# Patient Record
Sex: Male | Born: 1950 | Race: Black or African American | Hispanic: No | Marital: Married | Smoking: Former smoker
Health system: Southern US, Community
[De-identification: ages and names within clinical notes are randomized; demographics above are authoritative.]

## PROBLEM LIST (undated history)

## (undated) DIAGNOSIS — C189 Malignant neoplasm of colon, unspecified: Secondary | ICD-10-CM

## (undated) DIAGNOSIS — E119 Type 2 diabetes mellitus without complications: Secondary | ICD-10-CM

## (undated) DIAGNOSIS — I10 Essential (primary) hypertension: Secondary | ICD-10-CM

## (undated) DIAGNOSIS — E7800 Pure hypercholesterolemia, unspecified: Secondary | ICD-10-CM

## (undated) HISTORY — PX: BACK SURGERY: SHX140

## (undated) HISTORY — PX: OTHER SURGICAL HISTORY: SHX169

## (undated) HISTORY — DX: Malignant neoplasm of colon, unspecified: C18.9

## (undated) HISTORY — PX: CARPAL TUNNEL RELEASE: SHX101

## (undated) HISTORY — DX: Type 2 diabetes mellitus without complications: E11.9

## (undated) HISTORY — DX: Essential (primary) hypertension: I10

## (undated) HISTORY — DX: Pure hypercholesterolemia, unspecified: E78.00

---

## 2013-03-07 ENCOUNTER — Encounter (HOSPITAL_COMMUNITY): Payer: Self-pay | Admitting: Oncology

## 2013-03-07 ENCOUNTER — Encounter (HOSPITAL_COMMUNITY): Payer: 59 | Attending: Oncology | Admitting: Oncology

## 2013-03-07 VITALS — BP 120/52 | HR 76 | Temp 97.9°F | Resp 18 | Ht 69.25 in | Wt 172.2 lb

## 2013-03-07 DIAGNOSIS — C182 Malignant neoplasm of ascending colon: Secondary | ICD-10-CM

## 2013-03-07 DIAGNOSIS — I10 Essential (primary) hypertension: Secondary | ICD-10-CM | POA: Insufficient documentation

## 2013-03-07 DIAGNOSIS — D509 Iron deficiency anemia, unspecified: Secondary | ICD-10-CM

## 2013-03-07 DIAGNOSIS — Z807 Family history of other malignant neoplasms of lymphoid, hematopoietic and related tissues: Secondary | ICD-10-CM

## 2013-03-07 DIAGNOSIS — Z09 Encounter for follow-up examination after completed treatment for conditions other than malignant neoplasm: Secondary | ICD-10-CM | POA: Insufficient documentation

## 2013-03-07 DIAGNOSIS — E119 Type 2 diabetes mellitus without complications: Secondary | ICD-10-CM | POA: Insufficient documentation

## 2013-03-07 DIAGNOSIS — Z85038 Personal history of other malignant neoplasm of large intestine: Secondary | ICD-10-CM | POA: Insufficient documentation

## 2013-03-07 DIAGNOSIS — E7800 Pure hypercholesterolemia, unspecified: Secondary | ICD-10-CM | POA: Insufficient documentation

## 2013-03-07 LAB — CBC WITH DIFFERENTIAL/PLATELET
Basophils Relative: 1 % (ref 0–1)
HCT: 31.7 % — ABNORMAL LOW (ref 39.0–52.0)
Hemoglobin: 9.9 g/dL — ABNORMAL LOW (ref 13.0–17.0)
Lymphs Abs: 1.5 10*3/uL (ref 0.7–4.0)
MCHC: 31.2 g/dL (ref 30.0–36.0)
Monocytes Absolute: 0.4 10*3/uL (ref 0.1–1.0)
Monocytes Relative: 8 % (ref 3–12)
Neutro Abs: 2.5 10*3/uL (ref 1.7–7.7)
RBC: 4.3 MIL/uL (ref 4.22–5.81)

## 2013-03-07 LAB — COMPREHENSIVE METABOLIC PANEL
Albumin: 3.7 g/dL (ref 3.5–5.2)
Alkaline Phosphatase: 70 U/L (ref 39–117)
BUN: 6 mg/dL (ref 6–23)
CO2: 28 mEq/L (ref 19–32)
Chloride: 102 mEq/L (ref 96–112)
Creatinine, Ser: 0.91 mg/dL (ref 0.50–1.35)
GFR calc Af Amer: >90 mL/min (ref >90)
GFR calc non Af Amer: 88 mL/min — ABNORMAL LOW (ref >90)
Glucose, Bld: 139 mg/dL — ABNORMAL HIGH (ref 70–99)
Potassium: 3.1 mEq/L — ABNORMAL LOW (ref 3.5–5.1)
Total Bilirubin: 0.3 mg/dL (ref 0.3–1.2)

## 2013-03-07 NOTE — Patient Instructions (Addendum)
[facility] Discharge Instructions  RECOMMENDATIONS MADE BY THE CONSULTANT AND ANY TEST RESULTS WILL BE SENT TO YOUR REFERRING PHYSICIAN.  EXAM FINDINGS BY THE PHYSICIAN TODAY AND SIGNS OR SYMPTOMS TO REPORT TO CLINIC OR PRIMARY PHYSICIAN: Exam and discussion by MD.  Bonita Quin don't need chemotherapy.  You just need to be followed.  We will check some blood work today to see if your iron level is ok.  We will also check some additional blood work.  We will let you know if there are any problems.  MEDICATIONS PRESCRIBED:  none  INSTRUCTIONS GIVEN AND DISCUSSED: Report changes in bowel habits, blood in your bowel movements or other problems.  SPECIAL INSTRUCTIONS/FOLLOW-UP: Blood work every 3 months, CT scan of your abdomen in 1 year.  We will see your back in 6 months in follow-up.  Thank you for choosing [facility] to provide your oncology and hematology care.  To afford each patient quality time with our providers, please arrive at least 15 minutes before your scheduled appointment time.  With your help, our goal is to use those 15 minutes to complete the necessary work-up to ensure our physicians have the information they need to help with your evaluation and healthcare recommendations.    Effective January 1st, 2014, we ask that you re-schedule your appointment with our physicians should you arrive 10 or more minutes late for your appointment.  We strive to give you quality time with our providers, and arriving late affects you and other patients whose appointments are after yours.    Again, thank you for choosing [facility].  Our hope is that these requests will decrease the amount of time that you wait before being seen by our physicians.       _____________________________________________________________  Should you have questions after your visit to [facility], please contact our office at [phone] between the hours of 8:30  a.m. and 5:00 p.m.  Voicemails left after 4:30 p.m. will not be returned until the following business day.  For prescription refill requests, have your pharmacy contact our office with your prescription refill request.

## 2013-03-07 NOTE — Progress Notes (Signed)
Jackson Bowen presented for labwork. Labs per MD order drawn via Peripheral Line 23 gauge needle inserted in right AC  Good blood return present. Procedure without incident.  Needle removed intact. Patient tolerated procedure well.   

## 2013-03-07 NOTE — Progress Notes (Signed)
#1. Stage II A. 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 27 lymph nodes were identified and all were negative. No LV I was seen. No perineural invasion was seen. There is no extension to adjacent structures. The tumor was 7.1 x 6.0 x 2.6 cm. #2 iron deficiency anemia the time of presentation #3 history of cervical disc surgery years ago #4 macular degeneration receiving bevacizumab injections #5 poor dental hygiene #6 left carpal tunnel surgery #7 hypertension #8 diabetes mellitus #9 hypercholesterolemia  This is a very pleasant 62 year old [place] American gentleman for the [place] area is certainly week in September and was found to be anemic by his family physician. He was placed on oral iron recommended to have a colonoscopy. He put this off until he became weaker and weaker and was working until just a couple days before admission. After his hemoglobin was found to be very low he was transferred from the [facility] to the [facility] in [place]. He states he was not transfuse with any blood but was worked up with a CAT scan and colonoscopy and was operated on on 02/07/2013. We are in the process of obtaining all the records. We do have the pathology report. We have a postoperative visit.  He was a smoker for 15 pack years quit many years ago. He and his wife been married 42 years. They have a 76 year old daughter in good health. There is no family history of colon cancer. His father died of vascular disease. He was in his 6s. His mother died at age 65. She had developed congestive heart failure.  He has 2 full sisters and one half sister who died of myeloma many years ago.  He does not use alcohol or drugs. He has no known allergies. He works full-time. He walks for 30 minutes after each days work. His wife is with him today, she has rheumatoid  arthritis.  His oncology review of systems is noncontributory. He states he never had ice craving or sore tongue etc. He did however have colonoscopy at age 43 and interestingly was due to have a repeat colonoscopy in September 2013 when he started developing weakness and fatigue.  BP 120/52  Pulse 76  Temp(Src) 97.9 F (36.6 C) (Oral)  Resp 18  Ht 5' 9.25" (1.759 m)  Wt 172 lb 3.2 oz (78.109 kg)  BMI 25.24 kg/m2  Very pleasant gentleman who has a pigmented benign lesion right lower chin area which she states has been present for at least 40 years. He also has a pigmented nevus at the base of the left first toe laterally and a mildly pigmented lesion lateral right foot. He has decreased posterior tibialis pulses. He has 1+ dorsalis pedis pulses He has no palpable lymphadenopathy in the cervical, supraclavicular, infraclavicular, axillary or anal areas. He has a scar at the base of the neck posteriorly that is well-healed. Lungs are clear to auscultation and percussion. Heart exam shows a regular rhythm and rate without murmur rub or gallop. Nails look mildly pale. He has no obvious joint abnormalities. He has an upper dental plate and a few remaining right teeth which are in very poor repair. Tongue is unremarkable. Throat is clear. Pupils show cataracts bilaterally and small pupils. He has a soft abdomen well-healed scar in the center. He had a laparoscopic surgical procedure. All wounds are well-healed. Bowel sounds are normal. He has no hepatosplenomegaly. Bowel sounds are normal.  He has no obvious ascites. He has no leg edema or arm edema.  He is alert and oriented facial symmetry appears intact.  He needs a CEA every 3 months. I think a CAT scan of his abdomen and pelvis every year for 3 years would be also reasonable. He is not a candidate for chemotherapy with stage IIA disease and has a very good prognosis.  We will check his iron stores at this time. For 6 months he will continue his  iron pills one twice a day since they are not constipating and whatsoever.  I will see him in 6 months. He needs colonoscopy in a year. After his 6 month visit will be seeing him once a year basis

## 2013-03-08 LAB — CEA: CEA: 2.1 ng/mL (ref 0.0–5.0)

## 2013-03-08 LAB — FERRITIN: Ferritin: 145 ng/mL (ref 22–322)

## 2013-03-09 ENCOUNTER — Other Ambulatory Visit (HOSPITAL_COMMUNITY): Payer: Self-pay

## 2013-03-09 ENCOUNTER — Encounter (HOSPITAL_COMMUNITY): Payer: Self-pay

## 2013-03-09 DIAGNOSIS — E876 Hypokalemia: Secondary | ICD-10-CM

## 2013-03-09 MED ORDER — POTASSIUM CHLORIDE CRYS ER 20 MEQ PO TBCR: EXTENDED_RELEASE_TABLET | ORAL | Status: DC

## 2013-04-07 ENCOUNTER — Other Ambulatory Visit (HOSPITAL_COMMUNITY): Payer: Self-pay

## 2013-05-30 ENCOUNTER — Encounter (HOSPITAL_COMMUNITY): Payer: Self-pay

## 2013-05-30 ENCOUNTER — Encounter (HOSPITAL_COMMUNITY): Payer: 59 | Attending: Oncology

## 2013-05-30 DIAGNOSIS — Z85038 Personal history of other malignant neoplasm of large intestine: Secondary | ICD-10-CM

## 2013-05-30 NOTE — Progress Notes (Signed)
Labs drawn today for cea 

## 2013-08-08 ENCOUNTER — Encounter (HOSPITAL_COMMUNITY): Payer: 59 | Attending: Oncology

## 2013-08-08 DIAGNOSIS — C182 Malignant neoplasm of ascending colon: Secondary | ICD-10-CM

## 2013-08-08 DIAGNOSIS — Z85038 Personal history of other malignant neoplasm of large intestine: Secondary | ICD-10-CM

## 2013-08-08 NOTE — Progress Notes (Signed)
Labs drawn today for cea 

## 2013-08-09 ENCOUNTER — Encounter (HOSPITAL_COMMUNITY): Payer: Self-pay

## 2013-09-07 ENCOUNTER — Encounter (HOSPITAL_COMMUNITY): Payer: 59 | Attending: Hematology and Oncology

## 2013-09-07 ENCOUNTER — Encounter (HOSPITAL_COMMUNITY): Payer: Self-pay

## 2013-09-07 VITALS — BP 137/58 | HR 74 | Temp 97.9°F | Resp 20 | Wt 196.3 lb

## 2013-09-07 DIAGNOSIS — D509 Iron deficiency anemia, unspecified: Secondary | ICD-10-CM

## 2013-09-07 DIAGNOSIS — Z85038 Personal history of other malignant neoplasm of large intestine: Secondary | ICD-10-CM

## 2013-09-07 LAB — CBC WITH DIFFERENTIAL/PLATELET
Basophils Absolute: 0.1 10*3/uL (ref 0.0–0.1)
Eosinophils Relative: 3 % (ref 0–5)
HCT: 37.2 % — ABNORMAL LOW (ref 39.0–52.0)
Hemoglobin: 12.3 g/dL — ABNORMAL LOW (ref 13.0–17.0)
Lymphocytes Relative: 34 % (ref 12–46)
Lymphs Abs: 1.5 10*3/uL (ref 0.7–4.0)
MCHC: 33.1 g/dL (ref 30.0–36.0)
MCV: 82.3 fL (ref 78.0–100.0)
Monocytes Absolute: 0.4 10*3/uL (ref 0.1–1.0)
Monocytes Relative: 10 % (ref 3–12)
Neutro Abs: 2.2 10*3/uL (ref 1.7–7.7)
RDW: 12.8 % (ref 11.5–15.5)
WBC: 4.3 10*3/uL (ref 4.0–10.5)

## 2013-09-07 LAB — COMPREHENSIVE METABOLIC PANEL
BUN: 13 mg/dL (ref 6–23)
CO2: 27 mEq/L (ref 19–32)
Calcium: 9.7 mg/dL (ref 8.4–10.5)
Chloride: 98 mEq/L (ref 96–112)
Creatinine, Ser: 0.98 mg/dL (ref 0.50–1.35)
GFR calc Af Amer: >90 mL/min (ref >90)
GFR calc non Af Amer: 86 mL/min — ABNORMAL LOW (ref >90)
Glucose, Bld: 241 mg/dL — ABNORMAL HIGH (ref 70–99)
Total Bilirubin: 0.9 mg/dL (ref 0.3–1.2)

## 2013-09-07 LAB — FERRITIN: Ferritin: 83 ng/mL (ref 22–322)

## 2013-09-07 MED ORDER — ALPRAZOLAM 0.5 MG PO TABS: 0.5000 mg | ORAL_TABLET | Freq: Once | ORAL | Status: DC

## 2013-09-07 NOTE — Patient Instructions (Signed)
[facility] Discharge Instructions  RECOMMENDATIONS MADE BY THE CONSULTANT AND ANY TEST RESULTS WILL BE SENT TO YOUR REFERRING PHYSICIAN.  Return in 6 months for MD visit and repeat lab work.  Thank you for choosing [facility] to provide your oncology and hematology care.  To afford each patient quality time with our providers, please arrive at least 15 minutes before your scheduled appointment time.  With your help, our goal is to use those 15 minutes to complete the necessary work-up to ensure our physicians have the information they need to help with your evaluation and healthcare recommendations.    Effective January 1st, 2014, we ask that you re-schedule your appointment with our physicians should you arrive 10 or more minutes late for your appointment.  We strive to give you quality time with our providers, and arriving late affects you and other patients whose appointments are after yours.    Again, thank you for choosing [facility].  Our hope is that these requests will decrease the amount of time that you wait before being seen by our physicians.       _____________________________________________________________  Should you have questions after your visit to [facility], please contact our office at [phone] between the hours of 8:30 a.m. and 5:00 p.m.  Voicemails left after 4:30 p.m. will not be returned until the following business day.  For prescription refill requests, have your pharmacy contact our office with your prescription refill request.

## 2013-09-07 NOTE — Progress Notes (Signed)
[facility]  OFFICE PROGRESS NOTE  No primary provider on file. No primary provider on file.  DIAGNOSIS: History of colon cancer, stage II - Plan: CBC with Differential, Comprehensive metabolic panel, CBC with Differential, CBC with Differential, Comprehensive metabolic panel, Comprehensive metabolic panel, Comprehensive metabolic panel, CEA  Anemia, iron deficiency - Plan: Ferritin, Ferritin, Ferritin, CBC with Differential, Ferritin  Chief Complaint  Patient presents with  . Follow-up    colon cancer stage II,   . Anemia    iron deficiency    CURRENT THERAPY: Watchful expectation.  INTERVAL HISTORY: Jackson Bowen 62 y.o. male returns for followup of stage II-a adenocarcinoma of the ascending colon, status post right hemicolectomy on 02/07/2013 after which no postoperative therapy was recommended. 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 Appetite is good with no diarrhea, constipation, or hematochezia. He does have melena because he continues to take an iron-containing vitamin. He denies any fever, night sweats, lower extremity swelling or redness, nausea, vomiting, cough, but does experience nasal drip for which he has a nasal spray at home but never uses. He denies any earache, dysuria, hematuria, urinary hesitancy, joint pain, skin rash, headache, or seizures.  MEDICAL HISTORY: Past Medical History  Diagnosis Date  . Diabetes mellitus without complication   . Hypertension   . High blood cholesterol   . Colon cancer     INTERIM HISTORY:  does not have a problem list on file.   Stage II A. 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 27 lymph nodes were identified and all were negative. No LV I was seen. No perineural invasion was seen. There is no extension to adjacent structures. The tumor was 7.1 x 6.0 x 2.6  cm #2 iron deficiency anemia the time of presentation  #3 history of cervical disc surgery years ago  #4 macular degeneration receiving bevacizumab injections  #5 poor dental hygiene  #6 left carpal tunnel surgery  #7 hypertension  #8 diabetes mellitus  #9 hypercholesterolemia  ALLERGIES:  has No Known Allergies.  MEDICATIONS: has a current medication list which includes the following prescription(s): iron, lantus solostar, losartan-hydrochlorothiazide, lovastatin, metformin, UNABLE TO FIND, and fluticasone.  SURGICAL HISTORY:  Past Surgical History  Procedure Laterality Date  . Back surgery      for ruptured disc  . Carpal tunnel release      left hand  . Removal of cyst      right face  . Segmental colectomy      02/07/2013    FAMILY HISTORY: family history includes Cancer in his sister; Diabetes in his mother; Stroke in his father.  SOCIAL HISTORY:  reports that he has quit smoking. His smoking use included Cigarettes. He has a 15 pack-year smoking history. He has never used smokeless tobacco. He reports that he does not drink alcohol or use illicit drugs.  REVIEW OF SYSTEMS:  Other than that discussed above is noncontributory.  PHYSICAL EXAMINATION: ECOG PERFORMANCE STATUS: 0 - Asymptomatic  Blood pressure 137/58, pulse 74, temperature 97.9 F (36.6 C), temperature source Oral, resp. rate 20, weight 196 lb 4.8 oz (89.041 kg).  GENERAL:alert, no distress and comfortable SKIN: skin color, texture, turgor are normal, no rashes or significant lesions EYES: PERLA; Conjunctiva are pink and non-injected, sclera clear OROPHARYNX:no exudate, no erythema on lips, buccal mucosa, or tongue. NECK: supple, thyroid normal size,  non-tender, without nodularity. No masses CHEST: Normal AP diameter with no gynecomastia. LYMPH:  no palpable lymphadenopathy in the cervical, axillary or inguinal LUNGS: clear to auscultation and percussion with normal breathing effort HEART: regular rate &  rhythm and no murmurs. ABDOMEN:abdomen soft, non-tender and normal bowel sounds MUSCULOSKELETAL:no cyanosis of digits and no clubbing. Range of motion normal.  NEURO: alert & oriented x 3 with fluent speech, no focal motor/sensory deficits   LABORATORY DATA: Office Visit on 09/07/2013  Component Date Value Range Status  . WBC 09/07/2013 4.3  4.0 - 10.5 K/uL Final  . RBC 09/07/2013 4.52  4.22 - 5.81 MIL/uL Final  . Hemoglobin 09/07/2013 12.3* 13.0 - 17.0 g/dL Final  . HCT 95/62/1308 37.2* 39.0 - 52.0 % Final  . MCV 09/07/2013 82.3  78.0 - 100.0 fL Final  . MCH 09/07/2013 27.2  26.0 - 34.0 pg Final  . MCHC 09/07/2013 33.1  30.0 - 36.0 g/dL Final  . RDW 65/78/4696 12.8  11.5 - 15.5 % Final  . Platelets 09/07/2013 242  150 - 400 K/uL Final  . Neutrophils Relative % 09/07/2013 51  43 - 77 % Final  . Neutro Abs 09/07/2013 2.2  1.7 - 7.7 K/uL Final  . Lymphocytes Relative 09/07/2013 34  12 - 46 % Final  . Lymphs Abs 09/07/2013 1.5  0.7 - 4.0 K/uL Final  . Monocytes Relative 09/07/2013 10  3 - 12 % Final  . Monocytes Absolute 09/07/2013 0.4  0.1 - 1.0 K/uL Final  . Eosinophils Relative 09/07/2013 3  0 - 5 % Final  . Eosinophils Absolute 09/07/2013 0.1  0.0 - 0.7 K/uL Final  . Basophils Relative 09/07/2013 2* 0 - 1 % Final  . Basophils Absolute 09/07/2013 0.1  0.0 - 0.1 K/uL Final    PATHOLOGY: None new  Urinalysis No results found for this basename: colorurine,  appearanceur,  labspec,  phurine,  glucoseu,  hgbur,  bilirubinur,  ketonesur,  proteinur,  urobilinogen,  nitrite,  leukocytesur    RADIOGRAPHIC STUDIES: No results found.  ASSESSMENT:  #1.Stage II A. 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 27 lymph nodes were identified and all were negative. No LV I was seen. No perineural invasion was seen. There is no extension to adjacent structures. The tumor was 7.1 x 6.0 x 2.6 cm.    #2 iron deficiency anemia the time of presentation  #3 history of cervical disc surgery years ago  #4 macular degeneration receiving bevacizumab injections  #5 poor dental hygiene  #6 left carpal tunnel surgery  #7 hypertension  #8 diabetes mellitus  #9 hypercholesterolemia    PLAN:  #1. Prescription given for alprazolam 0.5-1 mg 40 minutes prior to CT scan to be performed in April of 2015. #2. Told to call should any new symptoms occur that are troublesome and persistent. #3. Office visit 6 months with blood tests including CBC, chem profile, ferritin, and CEA.   All questions were answered. The patient knows to call the clinic with any problems, questions or concerns. We can certainly see the patient much sooner if necessary.   I spent 25 minutes counseling the patient face to face. The total time spent in the appointment was 30 minutes.    Maurilio Lovely, MD 09/07/2013 10:33 AM

## 2013-11-07 ENCOUNTER — Encounter (HOSPITAL_COMMUNITY): Payer: 59 | Attending: Oncology

## 2013-11-07 DIAGNOSIS — Z85038 Personal history of other malignant neoplasm of large intestine: Secondary | ICD-10-CM

## 2013-11-07 LAB — CEA: CEA: 2.9 ng/mL (ref 0.0–5.0)

## 2013-11-07 NOTE — Progress Notes (Signed)
Labs drawn today for cea

## 2014-02-06 ENCOUNTER — Encounter (HOSPITAL_COMMUNITY): Payer: 59 | Attending: Oncology

## 2014-02-06 DIAGNOSIS — C182 Malignant neoplasm of ascending colon: Secondary | ICD-10-CM

## 2014-02-06 DIAGNOSIS — D509 Iron deficiency anemia, unspecified: Secondary | ICD-10-CM | POA: Insufficient documentation

## 2014-02-06 DIAGNOSIS — Z85038 Personal history of other malignant neoplasm of large intestine: Secondary | ICD-10-CM | POA: Insufficient documentation

## 2014-02-06 LAB — CBC WITH DIFFERENTIAL/PLATELET
BASOS ABS: 0 10*3/uL (ref 0.0–0.1)
BASOS PCT: 1 % (ref 0–1)
EOS ABS: 0.1 10*3/uL (ref 0.0–0.7)
EOS PCT: 3 % (ref 0–5)
HEMATOCRIT: 37.3 % — AB (ref 39.0–52.0)
HEMOGLOBIN: 12.2 g/dL — AB (ref 13.0–17.0)
Lymphocytes Relative: 32 % (ref 12–46)
Lymphs Abs: 1.2 10*3/uL (ref 0.7–4.0)
MCH: 26.9 pg (ref 26.0–34.0)
MCHC: 32.7 g/dL (ref 30.0–36.0)
MCV: 82.2 fL (ref 78.0–100.0)
MONO ABS: 0.3 10*3/uL (ref 0.1–1.0)
MONOS PCT: 8 % (ref 3–12)
Neutro Abs: 2.2 10*3/uL (ref 1.7–7.7)
Neutrophils Relative %: 56 % (ref 43–77)
Platelets: 213 10*3/uL (ref 150–400)
RBC: 4.54 MIL/uL (ref 4.22–5.81)
RDW: 12.7 % (ref 11.5–15.5)
WBC: 3.9 10*3/uL — ABNORMAL LOW (ref 4.0–10.5)

## 2014-02-06 LAB — COMPREHENSIVE METABOLIC PANEL
ALBUMIN: 3.7 g/dL (ref 3.5–5.2)
ALT: 29 U/L (ref 0–53)
AST: 37 U/L (ref 0–37)
Alkaline Phosphatase: 67 U/L (ref 39–117)
BILIRUBIN TOTAL: 0.9 mg/dL (ref 0.3–1.2)
BUN: 10 mg/dL (ref 6–23)
CALCIUM: 9.5 mg/dL (ref 8.4–10.5)
CO2: 29 mEq/L (ref 19–32)
Chloride: 98 mEq/L (ref 96–112)
Creatinine, Ser: 0.9 mg/dL (ref 0.50–1.35)
GFR calc Af Amer: >90 mL/min (ref >90)
GFR calc non Af Amer: 88 mL/min — ABNORMAL LOW (ref >90)
Glucose, Bld: 216 mg/dL — ABNORMAL HIGH (ref 70–99)
Potassium: 3.9 mEq/L (ref 3.7–5.3)
Sodium: 138 mEq/L (ref 137–147)
Total Protein: 7.3 g/dL (ref 6.0–8.3)

## 2014-02-06 LAB — FERRITIN: FERRITIN: 136 ng/mL (ref 22–322)

## 2014-02-06 LAB — CEA: CEA: 2.5 ng/mL (ref 0.0–5.0)

## 2014-02-06 NOTE — Progress Notes (Signed)
Labs  Drawn today for cea,cbc/diff,cmp,ferr

## 2014-02-23 ENCOUNTER — Other Ambulatory Visit (HOSPITAL_COMMUNITY): Payer: Self-pay

## 2014-02-23 DIAGNOSIS — D509 Iron deficiency anemia, unspecified: Secondary | ICD-10-CM

## 2014-02-23 DIAGNOSIS — C189 Malignant neoplasm of colon, unspecified: Secondary | ICD-10-CM

## 2014-02-24 ENCOUNTER — Encounter (HOSPITAL_COMMUNITY): Payer: Self-pay | Admitting: Oncology

## 2014-02-24 ENCOUNTER — Other Ambulatory Visit (HOSPITAL_COMMUNITY): Payer: Self-pay | Admitting: Oncology

## 2014-02-24 DIAGNOSIS — C189 Malignant neoplasm of colon, unspecified: Secondary | ICD-10-CM

## 2014-02-24 DIAGNOSIS — C182 Malignant neoplasm of ascending colon: Secondary | ICD-10-CM | POA: Insufficient documentation

## 2014-02-24 HISTORY — DX: Malignant neoplasm of colon, unspecified: C18.9

## 2014-02-27 ENCOUNTER — Ambulatory Visit (HOSPITAL_COMMUNITY): Admission: RE | Admit: 2014-02-27 | Payer: 59 | Source: Ambulatory Visit

## 2014-03-03 ENCOUNTER — Encounter (HOSPITAL_COMMUNITY): Payer: 59 | Attending: Oncology

## 2014-03-03 DIAGNOSIS — C182 Malignant neoplasm of ascending colon: Secondary | ICD-10-CM

## 2014-03-03 DIAGNOSIS — C189 Malignant neoplasm of colon, unspecified: Secondary | ICD-10-CM

## 2014-03-03 DIAGNOSIS — D509 Iron deficiency anemia, unspecified: Secondary | ICD-10-CM

## 2014-03-03 DIAGNOSIS — K7689 Other specified diseases of liver: Secondary | ICD-10-CM | POA: Insufficient documentation

## 2014-03-03 LAB — COMPREHENSIVE METABOLIC PANEL
ALBUMIN: 3.8 g/dL (ref 3.5–5.2)
ALK PHOS: 73 U/L (ref 39–117)
ALT: 29 U/L (ref 0–53)
AST: 36 U/L (ref 0–37)
BILIRUBIN TOTAL: 0.6 mg/dL (ref 0.3–1.2)
BUN: 14 mg/dL (ref 6–23)
CO2: 28 meq/L (ref 19–32)
Calcium: 9.8 mg/dL (ref 8.4–10.5)
Chloride: 99 mEq/L (ref 96–112)
Creatinine, Ser: 1.01 mg/dL (ref 0.50–1.35)
GFR calc Af Amer: 89 mL/min — ABNORMAL LOW (ref >90)
GFR, EST NON AFRICAN AMERICAN: 77 mL/min — AB (ref >90)
Glucose, Bld: 304 mg/dL — ABNORMAL HIGH (ref 70–99)
POTASSIUM: 4.1 meq/L (ref 3.7–5.3)
Sodium: 137 mEq/L (ref 137–147)
Total Protein: 7.3 g/dL (ref 6.0–8.3)

## 2014-03-03 LAB — CBC WITH DIFFERENTIAL/PLATELET
BASOS ABS: 0.1 10*3/uL (ref 0.0–0.1)
BASOS PCT: 1 % (ref 0–1)
Eosinophils Absolute: 0.1 10*3/uL (ref 0.0–0.7)
Eosinophils Relative: 3 % (ref 0–5)
HEMATOCRIT: 37.3 % — AB (ref 39.0–52.0)
HEMOGLOBIN: 12.1 g/dL — AB (ref 13.0–17.0)
Lymphocytes Relative: 37 % (ref 12–46)
Lymphs Abs: 1.5 10*3/uL (ref 0.7–4.0)
MCH: 26.5 pg (ref 26.0–34.0)
MCHC: 32.4 g/dL (ref 30.0–36.0)
MCV: 81.8 fL (ref 78.0–100.0)
MONO ABS: 0.4 10*3/uL (ref 0.1–1.0)
Monocytes Relative: 9 % (ref 3–12)
NEUTROS PCT: 50 % (ref 43–77)
Neutro Abs: 2 10*3/uL (ref 1.7–7.7)
Platelets: 215 10*3/uL (ref 150–400)
RBC: 4.56 MIL/uL (ref 4.22–5.81)
RDW: 13 % (ref 11.5–15.5)
WBC: 4 10*3/uL (ref 4.0–10.5)

## 2014-03-03 LAB — FERRITIN: Ferritin: 122 ng/mL (ref 22–322)

## 2014-03-03 NOTE — Progress Notes (Signed)
Aviv R Drew presented for labwork. Labs per MD order drawn via Peripheral Line 23 gauge needle inserted in right AC  Good blood return present. Procedure without incident.  Needle removed intact. Patient tolerated procedure well.   

## 2014-03-04 LAB — CEA: CEA: 2.7 ng/mL (ref 0.0–5.0)

## 2014-03-06 ENCOUNTER — Ambulatory Visit (HOSPITAL_COMMUNITY): Payer: 59

## 2014-03-06 ENCOUNTER — Other Ambulatory Visit (HOSPITAL_COMMUNITY): Payer: 59

## 2014-03-07 ENCOUNTER — Ambulatory Visit (HOSPITAL_COMMUNITY)
Admission: RE | Admit: 2014-03-07 | Discharge: 2014-03-07 | Disposition: A | Discharge status: Home / Self Care | Payer: 59 | Source: Ambulatory Visit | Attending: Oncology | Admitting: Oncology

## 2014-03-07 ENCOUNTER — Ambulatory Visit (HOSPITAL_COMMUNITY): Payer: 59

## 2014-03-07 DIAGNOSIS — K7689 Other specified diseases of liver: Secondary | ICD-10-CM | POA: Insufficient documentation

## 2014-03-07 DIAGNOSIS — C189 Malignant neoplasm of colon, unspecified: Secondary | ICD-10-CM

## 2014-03-07 MED ORDER — IOHEXOL 300 MG/ML  SOLN
100.0000 mL | Freq: Once | INTRAMUSCULAR | Status: AC | PRN
  Administered 2014-03-07: 100 mL via INTRAVENOUS

## 2014-03-09 ENCOUNTER — Encounter (HOSPITAL_BASED_OUTPATIENT_CLINIC_OR_DEPARTMENT_OTHER): Payer: 59

## 2014-03-09 ENCOUNTER — Encounter (HOSPITAL_COMMUNITY): Payer: Self-pay

## 2014-03-09 VITALS — BP 123/73 | HR 68 | Temp 98.1°F | Resp 18 | Wt 198.6 lb

## 2014-03-09 DIAGNOSIS — K760 Fatty (change of) liver, not elsewhere classified: Secondary | ICD-10-CM

## 2014-03-09 DIAGNOSIS — C189 Malignant neoplasm of colon, unspecified: Secondary | ICD-10-CM

## 2014-03-09 DIAGNOSIS — Z85038 Personal history of other malignant neoplasm of large intestine: Secondary | ICD-10-CM

## 2014-03-09 DIAGNOSIS — D509 Iron deficiency anemia, unspecified: Secondary | ICD-10-CM

## 2014-03-09 NOTE — Progress Notes (Signed)
[facility]  OFFICE PROGRESS NOTE  No PCP Per Patient No address on file  DIAGNOSIS: Colon cancer - Plan: CBC with Differential, Comprehensive metabolic panel, CEA  Iron deficiency anemia - Plan: CBC with Differential  Hepatic steatosis  Chief Complaint  Patient presents with  . Colon Cancer    CURRENT THERAPY: Watchful expectation for stage II-a colon cancer  INTERVAL HISTORY: Jackson Bowen 63 y.o. 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 He continues to do well with good appetite. Bowel movements are regular with no melena, hematochezia, diarrhea, constipation, incontinence, nasal drip, sore throat, earache, cough, shortness of breath, wheezing, fever, night sweats, he sees satiety, abdominal distention or pain, lower extremity swelling or redness, joint pain, skin rash, headache, or seizures.   MEDICAL HISTORY: Past Medical History  Diagnosis Date  . Diabetes mellitus without complication   . Hypertension   . High blood cholesterol   . Colon cancer   . Colon cancer 02/24/2014    INTERIM HISTORY: has Colon cancer on his problem list.    ALLERGIES:  has No Known Allergies.  MEDICATIONS: has a current medication list which includes the following prescription(s): fluticasone, iron, lantus solostar, losartan-hydrochlorothiazide, lovastatin, metformin, and UNABLE TO FIND.  SURGICAL HISTORY:  Past Surgical History  Procedure Laterality Date  . Back surgery      for ruptured disc  . Carpal tunnel release      left hand  . Removal of cyst      right face  . Segmental colectomy      02/07/2013    FAMILY HISTORY: family history includes Cancer in his sister; Diabetes in his mother; Stroke in his father.  SOCIAL HISTORY:  reports that he has quit smoking.  His smoking use included Cigarettes. He has a 15 pack-year smoking history. He has never used smokeless tobacco. He reports that he does not drink alcohol or use illicit drugs.  REVIEW OF SYSTEMS:  Other than that discussed above is noncontributory.  PHYSICAL EXAMINATION: ECOG PERFORMANCE STATUS: 0 - Asymptomatic  Blood pressure 123/73, pulse 68, temperature 98.1 F (36.7 C), temperature source Oral, resp. rate 18, weight 198 lb 9.6 oz (90.084 kg).  GENERAL:alert, no distress and comfortable SKIN: skin color, texture, turgor are normal, no rashes or significant lesions EYES: PERLA; Conjunctiva are pink and non-injected, sclera clear SINUSES: No redness or tenderness over maxillary or ethmoid sinuses OROPHARYNX:no exudate, no erythema on lips, buccal mucosa, or tongue. NECK: supple, thyroid normal size, non-tender, without nodularity. No masses CHEST: Normal AP diameter with no gynecomastia.. Surgical wound well healed with no evidence of hepatosplenomegaly, ascites, or CVA tenderness. LYMPH:  no palpable lymphadenopathy in the cervical, axillary or inguinal LUNGS: clear to auscultation and percussion with normal breathing effort HEART: regular rate & rhythm and no murmurs. ABDOMEN:abdomen soft, non-tender and normal bowel sounds MUSCULOSKELETAL:no cyanosis of digits and no clubbing. Range of motion normal.  NEURO: alert & oriented x 3 with fluent speech, no focal motor/sensory deficits   LABORATORY DATA: Infusion on 03/03/2014  Component Date Value Ref Range Status  . WBC 03/03/2014 4.0  4.0 - 10.5 K/uL Final  . RBC 03/03/2014 4.56  4.22 - 5.81 MIL/uL Final  . Hemoglobin 03/03/2014 12.1* 13.0 - 17.0 g/dL Final  . HCT 03/03/2014 37.3* 39.0 - 52.0 % Final  .  MCV 03/03/2014 81.8  78.0 - 100.0 fL Final  . MCH 03/03/2014 26.5  26.0 - 34.0 pg Final  . MCHC 03/03/2014 32.4  30.0 - 36.0 g/dL Final  . RDW 03/03/2014 13.0  11.5 - 15.5 % Final  . Platelets 03/03/2014 215  150 - 400 K/uL  Final  . Neutrophils Relative % 03/03/2014 50  43 - 77 % Final  . Neutro Abs 03/03/2014 2.0  1.7 - 7.7 K/uL Final  . Lymphocytes Relative 03/03/2014 37  12 - 46 % Final  . Lymphs Abs 03/03/2014 1.5  0.7 - 4.0 K/uL Final  . Monocytes Relative 03/03/2014 9  3 - 12 % Final  . Monocytes Absolute 03/03/2014 0.4  0.1 - 1.0 K/uL Final  . Eosinophils Relative 03/03/2014 3  0 - 5 % Final  . Eosinophils Absolute 03/03/2014 0.1  0.0 - 0.7 K/uL Final  . Basophils Relative 03/03/2014 1  0 - 1 % Final  . Basophils Absolute 03/03/2014 0.1  0.0 - 0.1 K/uL Final  . Sodium 03/03/2014 137  137 - 147 mEq/L Final  . Potassium 03/03/2014 4.1  3.7 - 5.3 mEq/L Final  . Chloride 03/03/2014 99  96 - 112 mEq/L Final  . CO2 03/03/2014 28  19 - 32 mEq/L Final  . Glucose, Bld 03/03/2014 304* 70 - 99 mg/dL Final  . BUN 03/03/2014 14  6 - 23 mg/dL Final  . Creatinine, Ser 03/03/2014 1.01  0.50 - 1.35 mg/dL Final  . Calcium 03/03/2014 9.8  8.4 - 10.5 mg/dL Final  . Total Protein 03/03/2014 7.3  6.0 - 8.3 g/dL Final  . Albumin 03/03/2014 3.8  3.5 - 5.2 g/dL Final  . AST 03/03/2014 36  0 - 37 U/L Final  . ALT 03/03/2014 29  0 - 53 U/L Final  . Alkaline Phosphatase 03/03/2014 73  39 - 117 U/L Final  . Total Bilirubin 03/03/2014 0.6  0.3 - 1.2 mg/dL Final  . GFR calc non Af Amer 03/03/2014 77* >90 mL/min Final  . GFR calc Af Amer 03/03/2014 89* >90 mL/min Final   Comment: (NOTE)                          The eGFR has been calculated using the CKD EPI equation.                          This calculation has not been validated in all clinical situations.                          eGFR's persistently <90 mL/min signify possible Chronic Kidney                          Disease.  . CEA 03/03/2014 2.7  0.0 - 5.0 ng/mL Final   Performed at [facility]  . Ferritin 03/03/2014 122  22 - 322 ng/mL Final   Performed at [facility]: No new pathology.  Urinalysis No results found for this basename:  colorurine,  appearanceur,  labspec,  phurine,  glucoseu,  hgbur,  bilirubinur,  ketonesur,  proteinur,  urobilinogen,  nitrite,  leukocytesur    RADIOGRAPHIC STUDIES: Ct Abdomen Pelvis W Contrast  03/07/2014   CLINICAL DATA:  Followup stage II colon carcinoma.  EXAM: CT ABDOMEN AND PELVIS WITH CONTRAST  TECHNIQUE: Multidetector CT imaging of  the abdomen and pelvis was performed using the standard protocol following bolus administration of intravenous contrast.  CONTRAST:  19m OMNIPAQUE IOHEXOL 300 MG/ML  SOLN  COMPARISON:  None.  FINDINGS: Postop changes are seen from previous right colectomy. Moderate to severe hepatic steatosis is demonstrated, but no liver masses are identified. The gallbladder, pancreas, spleen, adrenal glands are normal in appearance. A small subcapsular cyst is seen in the posterior midpole left kidney, however there is no evidence of complex cystic or solid renal masses. No evidence hydronephrosis.  No lymphadenopathy identified within the abdomen or pelvis. No evidence of bowel wall thickening or dilatation. No evidence of inflammatory process or abnormal fluid collections. No suspicious bone lesions identified.  IMPRESSION: No evidence of recurrent or metastatic carcinoma within the abdomen or pelvis.  Moderate to severe hepatic steatosis.   Electronically Signed   By: JEarle GellM.D.   On: 03/07/2014 09:42    ASSESSMENT:  #1.Stage II A. 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 No LV I was seen. No perineural invasion was seen. There was no extension to adjacent structures. The tumor was 7.1 x 6.0 x 2.6 cm., no evidence of disease. #2 iron deficiency anemia the time of presentation  #3 history of cervical disc surgery years ago  #4 macular degeneration receiving bevacizumab injections  #5 poor dental hygiene  #6 left  carpal tunnel surgery  #7 hypertension  #8 diabetes mellitus  #9 hypercholesterolemia   PLAN:  #1. Continue watchful expectation. #2. Repeat colonoscopy in September 2015. #3. Office visit in 6 months with CBC, chem profile, CEA, and ferritin.   All questions were answered. The patient knows to call the clinic with any problems, questions or concerns. We can certainly see the patient much sooner if necessary.   I spent 25 minutes counseling the patient face to face. The total time spent in the appointment was 30 minutes.    GFarrel Gobble MD 03/09/2014 1:35 PM  DISCLAIMER:  This note was dictated with voice recognition software.  Similar sounding words can inadvertently be transcribed inaccurately and may not be corrected upon review.

## 2014-03-09 NOTE — Addendum Note (Signed)
Addended by: Elenor Legato on: 03/09/2014 01:51 PM   Modules accepted: Orders

## 2014-03-09 NOTE — Patient Instructions (Signed)
[facility] Discharge Instructions  RECOMMENDATIONS MADE BY THE CONSULTANT AND ANY TEST RESULTS WILL BE SENT TO YOUR REFERRING PHYSICIAN.  EXAM FINDINGS BY THE PHYSICIAN TODAY AND SIGNS OR SYMPTOMS TO REPORT TO CLINIC OR PRIMARY PHYSICIAN: dr Barnet Glasgow    INSTRUCTIONS GIVEN AND DISCUSSED:follow up 6 months with labs and doctor    Thank you for choosing [facility] to provide your oncology and hematology care.  To afford each patient quality time with our providers, please arrive at least 15 minutes before your scheduled appointment time.  With your help, our goal is to use those 15 minutes to complete the necessary work-up to ensure our physicians have the information they need to help with your evaluation and healthcare recommendations.    Effective January 1st, 2014, we ask that you re-schedule your appointment with our physicians should you arrive 10 or more minutes late for your appointment.  We strive to give you quality time with our providers, and arriving late affects you and other patients whose appointments are after yours.    Again, thank you for choosing [facility].  Our hope is that these requests will decrease the amount of time that you wait before being seen by our physicians.       _____________________________________________________________  Should you have questions after your visit to [facility], please contact our office at [phone] between the hours of 8:30 a.m. and 5:00 p.m.  Voicemails left after 4:30 p.m. will not be returned until the following business day.  For prescription refill requests, have your pharmacy contact our office with your prescription refill request.

## 2014-09-12 ENCOUNTER — Other Ambulatory Visit (HOSPITAL_COMMUNITY): Payer: 59

## 2014-09-15 ENCOUNTER — Ambulatory Visit (HOSPITAL_COMMUNITY): Payer: 59

## 2014-09-16 NOTE — Progress Notes (Signed)
This encounter was created in error - please disregard.

## 2014-09-21 ENCOUNTER — Encounter (HOSPITAL_COMMUNITY): Payer: 59 | Attending: Hematology and Oncology

## 2014-09-21 ENCOUNTER — Encounter (HOSPITAL_COMMUNITY): Payer: Self-pay

## 2014-09-21 VITALS — BP 107/48 | HR 78 | Temp 99.3°F | Resp 16 | Wt 195.8 lb

## 2014-09-21 DIAGNOSIS — K760 Fatty (change of) liver, not elsewhere classified: Secondary | ICD-10-CM | POA: Diagnosis not present

## 2014-09-21 DIAGNOSIS — D509 Iron deficiency anemia, unspecified: Secondary | ICD-10-CM | POA: Diagnosis not present

## 2014-09-21 DIAGNOSIS — Z85038 Personal history of other malignant neoplasm of large intestine: Secondary | ICD-10-CM

## 2014-09-21 DIAGNOSIS — E119 Type 2 diabetes mellitus without complications: Secondary | ICD-10-CM

## 2014-09-21 DIAGNOSIS — C189 Malignant neoplasm of colon, unspecified: Secondary | ICD-10-CM | POA: Insufficient documentation

## 2014-09-21 DIAGNOSIS — I10 Essential (primary) hypertension: Secondary | ICD-10-CM

## 2014-09-21 LAB — COMPREHENSIVE METABOLIC PANEL
ALT: 26 U/L (ref 0–53)
ANION GAP: 13 (ref 5–15)
AST: 34 U/L (ref 0–37)
Albumin: 3.8 g/dL (ref 3.5–5.2)
Alkaline Phosphatase: 69 U/L (ref 39–117)
BUN: 10 mg/dL (ref 6–23)
CALCIUM: 9.3 mg/dL (ref 8.4–10.5)
CO2: 27 mEq/L (ref 19–32)
Chloride: 98 mEq/L (ref 96–112)
Creatinine, Ser: 1.03 mg/dL (ref 0.50–1.35)
GFR calc Af Amer: 87 mL/min — ABNORMAL LOW (ref >90)
GFR calc non Af Amer: 75 mL/min — ABNORMAL LOW (ref >90)
Glucose, Bld: 187 mg/dL — ABNORMAL HIGH (ref 70–99)
POTASSIUM: 3.6 meq/L — AB (ref 3.7–5.3)
SODIUM: 138 meq/L (ref 137–147)
TOTAL PROTEIN: 7.1 g/dL (ref 6.0–8.3)
Total Bilirubin: 0.5 mg/dL (ref 0.3–1.2)

## 2014-09-21 LAB — CBC WITH DIFFERENTIAL/PLATELET
Basophils Absolute: 0.1 10*3/uL (ref 0.0–0.1)
Basophils Relative: 1 % (ref 0–1)
EOS ABS: 0.1 10*3/uL (ref 0.0–0.7)
Eosinophils Relative: 2 % (ref 0–5)
HCT: 37.3 % — ABNORMAL LOW (ref 39.0–52.0)
Hemoglobin: 12.6 g/dL — ABNORMAL LOW (ref 13.0–17.0)
Lymphocytes Relative: 35 % (ref 12–46)
Lymphs Abs: 1.6 10*3/uL (ref 0.7–4.0)
MCH: 27.9 pg (ref 26.0–34.0)
MCHC: 33.8 g/dL (ref 30.0–36.0)
MCV: 82.7 fL (ref 78.0–100.0)
Monocytes Absolute: 0.4 10*3/uL (ref 0.1–1.0)
Monocytes Relative: 9 % (ref 3–12)
NEUTROS PCT: 53 % (ref 43–77)
Neutro Abs: 2.4 10*3/uL (ref 1.7–7.7)
PLATELETS: 256 10*3/uL (ref 150–400)
RBC: 4.51 MIL/uL (ref 4.22–5.81)
RDW: 13 % (ref 11.5–15.5)
WBC: 4.5 10*3/uL (ref 4.0–10.5)

## 2014-09-21 LAB — FERRITIN: FERRITIN: 107 ng/mL (ref 22–322)

## 2014-09-21 NOTE — Patient Instructions (Addendum)
[facility] Discharge Instructions  RECOMMENDATIONS MADE BY THE CONSULTANT AND ANY TEST RESULTS WILL BE SENT TO YOUR REFERRING PHYSICIAN.  EXAM FINDINGS BY THE PHYSICIAN TODAY AND SIGNS OR SYMPTOMS TO REPORT TO CLINIC OR PRIMARY PHYSICIAN: Exam and findings as discussed by Dr. Barnet Glasgow.  Please contact your gastroenterologist and get a colonoscopy scheduled before the end of the year if possible.  If any issues with your labs we will contact you.  Report unexplained weight loss, blood in your bowel movement or other concerns.     INSTRUCTIONS/FOLLOW-UP: Follow-up in 6 months with labs and office visit.  Thank you for choosing [facility] to provide your oncology and hematology care.  To afford each patient quality time with our providers, please arrive at least 15 minutes before your scheduled appointment time.  With your help, our goal is to use those 15 minutes to complete the necessary work-up to ensure our physicians have the information they need to help with your evaluation and healthcare recommendations.    Effective January 1st, 2014, we ask that you re-schedule your appointment with our physicians should you arrive 10 or more minutes late for your appointment.  We strive to give you quality time with our providers, and arriving late affects you and other patients whose appointments are after yours.    Again, thank you for choosing [facility].  Our hope is that these requests will decrease the amount of time that you wait before being seen by our physicians.       _____________________________________________________________  Should you have questions after your visit to [facility], please contact our office at [phone] between the hours of 8:30 a.m. and 4:30 p.m.  Voicemails left after 4:30 p.m. will not be returned until the following business day.  For prescription refill requests, have your pharmacy contact our  office with your prescription refill request.    _______________________________________________________________  We hope that we have given you very good care.  You may receive a patient satisfaction survey in the mail, please complete it and return it as soon as possible.  We value your feedback!  _______________________________________________________________  Have you asked about our STAR program?  STAR stands for Survivorship Training and Rehabilitation, and this is a nationally recognized cancer care program that focuses on survivorship and rehabilitation.  Cancer and cancer treatments may cause problems, such as, pain, making you feel tired and keeping you from doing the things that you need or want to do. Cancer rehabilitation can help. Our goal is to reduce these troubling effects and help you have the best quality of life possible.  You may receive a survey from a nurse that asks questions about your current state of health.  Based on the survey results, all eligible patients will be referred to the [facility] program for an evaluation so we can better serve you!  A frequently asked questions sheet is available upon request.

## 2014-09-21 NOTE — Progress Notes (Signed)
[facility]  OFFICE PROGRESS NOTE  No PCP Per Patient No address on file  DIAGNOSIS: Colon cancer - Plan: CBC with Differential, Comprehensive metabolic panel, CEA, Ferritin, CBC with Differential, Ferritin, Comprehensive metabolic panel, CEA  Iron deficiency anemia - Plan: CBC with Differential, Comprehensive metabolic panel, CEA, Ferritin, CBC with Differential, Ferritin, Comprehensive metabolic panel, CEA  Hepatic steatosis - Plan: CBC with Differential, Comprehensive metabolic panel, CEA, Ferritin, CBC with Differential, Ferritin, Comprehensive metabolic panel, CEA  Chief Complaint  Patient presents with  . Colon Cancer    CURRENT THERAPY: Watchful expectation for stage II-A colon cancer  INTERVAL HISTORY: Jackson Bowen 63 y.o. 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 He continues to do well with excellent appetite and no nausea, vomiting, diarrhea, or constipation. He denies any melena, hematochezia, hematuria, urinary hesitancy, cough, wheezing, sore throat, skin rash, fever, night sweats, lower extremity swelling or redness, joint pain, headache, or seizures. He has been found to have slightly elevated hemoglobin A1c.  MEDICAL HISTORY: Past Medical History  Diagnosis Date  . Diabetes mellitus without complication   . Hypertension   . High blood cholesterol   . Colon cancer   . Colon cancer 02/24/2014    INTERIM HISTORY: has Colon cancer on his problem list.    ALLERGIES:  has No Known Allergies.  MEDICATIONS: has a current medication list which includes the following prescription(s): iron, lantus solostar, losartan-hydrochlorothiazide, lovastatin, metformin, UNABLE TO FIND, and fluticasone.  SURGICAL HISTORY:  Past Surgical History  Procedure Laterality  Date  . Back surgery      for ruptured disc  . Carpal tunnel release      left hand  . Removal of cyst      right face  . Segmental colectomy      02/07/2013    FAMILY HISTORY: family history includes Cancer in his sister; Diabetes in his mother; Stroke in his father.  SOCIAL HISTORY:  reports that he has quit smoking. His smoking use included Cigarettes. He has a 15 pack-year smoking history. He has never used smokeless tobacco. He reports that he does not drink alcohol or use illicit drugs.  REVIEW OF SYSTEMS:  Other than that discussed above is noncontributory.  PHYSICAL EXAMINATION: ECOG PERFORMANCE STATUS: 1 - Symptomatic but completely ambulatory  Blood pressure 107/48, pulse 78, temperature 99.3 F (37.4 C), temperature source Oral, resp. rate 16, weight 195 lb 12.8 oz (88.814 kg), SpO2 98 %.  GENERAL:alert, no distress and comfortable SKIN: skin color, texture, turgor are normal, no rashes or significant lesions EYES: PERLA; Conjunctiva are pink and non-injected, sclera clear SINUSES: No redness or tenderness over maxillary or ethmoid sinuses OROPHARYNX:no exudate, no erythema on lips, buccal mucosa, or tongue. NECK: supple, thyroid normal size, non-tender, without nodularity. No masses CHEST: Normal AP diameter with no breast masses. LYMPH:  no palpable lymphadenopathy in the cervical, axillary or inguinal LUNGS: clear to auscultation and percussion with normal breathing effort HEART: regular rate & rhythm and no murmurs. ABDOMEN:abdomen soft, non-tender and normal bowel sounds. No hepatomegaly, ascites, or CVA tenderness. MUSCULOSKELETAL:no cyanosis of digits and no clubbing. Range of motion normal.  NEURO: alert & oriented x 3 with fluent speech, no focal motor/sensory deficits   LABORATORY DATA: Office Visit on 09/21/2014  Component Date Value Ref Range Status  .  WBC 09/21/2014 4.5  4.0 - 10.5 K/uL Final  . RBC 09/21/2014 4.51  4.22 - 5.81 MIL/uL Final  .  Hemoglobin 09/21/2014 12.6* 13.0 - 17.0 g/dL Final  . HCT 09/21/2014 37.3* 39.0 - 52.0 % Final  . MCV 09/21/2014 82.7  78.0 - 100.0 fL Final  . MCH 09/21/2014 27.9  26.0 - 34.0 pg Final  . MCHC 09/21/2014 33.8  30.0 - 36.0 g/dL Final  . RDW 09/21/2014 13.0  11.5 - 15.5 % Final  . Platelets 09/21/2014 256  150 - 400 K/uL Final  . Neutrophils Relative % 09/21/2014 53  43 - 77 % Final  . Neutro Abs 09/21/2014 2.4  1.7 - 7.7 K/uL Final  . Lymphocytes Relative 09/21/2014 35  12 - 46 % Final  . Lymphs Abs 09/21/2014 1.6  0.7 - 4.0 K/uL Final  . Monocytes Relative 09/21/2014 9  3 - 12 % Final  . Monocytes Absolute 09/21/2014 0.4  0.1 - 1.0 K/uL Final  . Eosinophils Relative 09/21/2014 2  0 - 5 % Final  . Eosinophils Absolute 09/21/2014 0.1  0.0 - 0.7 K/uL Final  . Basophils Relative 09/21/2014 1  0 - 1 % Final  . Basophils Absolute 09/21/2014 0.1  0.0 - 0.1 K/uL Final  . Sodium 09/21/2014 138  137 - 147 mEq/L Final  . Potassium 09/21/2014 3.6* 3.7 - 5.3 mEq/L Final  . Chloride 09/21/2014 98  96 - 112 mEq/L Final  . CO2 09/21/2014 27  19 - 32 mEq/L Final  . Glucose, Bld 09/21/2014 187* 70 - 99 mg/dL Final  . BUN 09/21/2014 10  6 - 23 mg/dL Final  . Creatinine, Ser 09/21/2014 1.03  0.50 - 1.35 mg/dL Final  . Calcium 09/21/2014 9.3  8.4 - 10.5 mg/dL Final  . Total Protein 09/21/2014 7.1  6.0 - 8.3 g/dL Final  . Albumin 09/21/2014 3.8  3.5 - 5.2 g/dL Final  . AST 09/21/2014 34  0 - 37 U/L Final  . ALT 09/21/2014 26  0 - 53 U/L Final  . Alkaline Phosphatase 09/21/2014 69  39 - 117 U/L Final  . Total Bilirubin 09/21/2014 0.5  0.3 - 1.2 mg/dL Final  . GFR calc non Af Amer 09/21/2014 75* >90 mL/min Final  . GFR calc Af Amer 09/21/2014 87* >90 mL/min Final   Comment: (NOTE) The eGFR has been calculated using the CKD EPI equation. This calculation has not been validated in all clinical situations. eGFR's persistently <90 mL/min signify possible Chronic Kidney Disease.   . Anion gap 09/21/2014  13  5 - 15 Final    PATHOLOGY: No new pathology.  Urinalysis No results found for: COLORURINE, APPEARANCEUR, LABSPEC, PHURINE, GLUCOSEU, HGBUR, BILIRUBINUR, KETONESUR, PROTEINUR, UROBILINOGEN, NITRITE, LEUKOCYTESUR  RADIOGRAPHIC STUDIES: No results found.  ASSESSMENT:  #1.Stage II A. (T3, N0, M0) moderately differentiated adenocarcinoma of the ascending colon status post right hemicolectomy on 02/07/2013 at [facility]. 27 lymph nodes were identified and all were negative. No LV I was seen. No perineural invasion was seen. There was no extension to adjacent structures. The tumor was 7.1 x 6.0 x 2.6 cm., no evidence of disease. #2 iron deficiency anemia the time of presentation  #3 history of cervical disc surgery years ago  #4 macular degeneration receiving bevacizumab injections  #5 poor dental hygiene  #6 left carpal tunnel surgery  #7 hypertension  #8 diabetes mellitus  #9 hypercholesterolemia   PLAN:  #1. Colonoscopy before the end of the year. #2. Follow-up in 6 months  with CBC, chem profile, CEA.   All questions were answered. The patient knows to call the clinic with any problems, questions or concerns. We can certainly see the patient much sooner if necessary.   I spent 25 minutes counseling the patient face to face. The total time spent in the appointment was 30 minutes.    Doroteo Bradford, MD 09/21/2014 6:50 PM  DISCLAIMER:  This note was dictated with voice recognition software.  Similar sounding words can inadvertently be transcribed inaccurately and may not be corrected upon review.

## 2014-09-21 NOTE — Progress Notes (Signed)
Jackson Bowen presented for labwork. Labs per MD order drawn via Peripheral Line 23 gauge needle inserted in right AC  Good blood return present. Procedure without incident.  Needle removed intact. Patient tolerated procedure well.

## 2014-09-22 LAB — CEA: CEA: 2.6 ng/mL (ref 0.0–5.0)

## 2015-03-22 ENCOUNTER — Encounter (HOSPITAL_COMMUNITY): Payer: Self-pay | Admitting: Hematology & Oncology

## 2015-03-22 ENCOUNTER — Encounter (HOSPITAL_COMMUNITY): Payer: 59 | Attending: Hematology & Oncology

## 2015-03-22 ENCOUNTER — Ambulatory Visit (HOSPITAL_COMMUNITY): Payer: 59 | Admitting: Hematology & Oncology

## 2015-03-22 ENCOUNTER — Encounter (HOSPITAL_BASED_OUTPATIENT_CLINIC_OR_DEPARTMENT_OTHER): Payer: 59 | Admitting: Hematology & Oncology

## 2015-03-22 VITALS — BP 122/61 | HR 77 | Temp 98.4°F | Resp 16 | Wt 196.6 lb

## 2015-03-22 DIAGNOSIS — C189 Malignant neoplasm of colon, unspecified: Secondary | ICD-10-CM | POA: Insufficient documentation

## 2015-03-22 DIAGNOSIS — K760 Fatty (change of) liver, not elsewhere classified: Secondary | ICD-10-CM

## 2015-03-22 DIAGNOSIS — Z85038 Personal history of other malignant neoplasm of large intestine: Secondary | ICD-10-CM

## 2015-03-22 DIAGNOSIS — D509 Iron deficiency anemia, unspecified: Secondary | ICD-10-CM | POA: Insufficient documentation

## 2015-03-22 DIAGNOSIS — D649 Anemia, unspecified: Secondary | ICD-10-CM

## 2015-03-22 LAB — COMPREHENSIVE METABOLIC PANEL
ALT: 20 U/L (ref 17–63)
ANION GAP: 9 (ref 5–15)
AST: 28 U/L (ref 15–41)
Albumin: 4 g/dL (ref 3.5–5.0)
Alkaline Phosphatase: 66 U/L (ref 38–126)
BILIRUBIN TOTAL: 0.9 mg/dL (ref 0.3–1.2)
BUN: 11 mg/dL (ref 6–20)
CHLORIDE: 99 mmol/L — AB (ref 101–111)
CO2: 28 mmol/L (ref 22–32)
CREATININE: 1.11 mg/dL (ref 0.61–1.24)
Calcium: 9.2 mg/dL (ref 8.9–10.3)
GFR calc Af Amer: >60 mL/min (ref >60)
Glucose, Bld: 280 mg/dL — ABNORMAL HIGH (ref 65–99)
Potassium: 3.6 mmol/L (ref 3.5–5.1)
SODIUM: 136 mmol/L (ref 135–145)
Total Protein: 7 g/dL (ref 6.5–8.1)

## 2015-03-22 LAB — CBC WITH DIFFERENTIAL/PLATELET
Basophils Absolute: 0 10*3/uL (ref 0.0–0.1)
Basophils Relative: 1 % (ref 0–1)
EOS ABS: 0.1 10*3/uL (ref 0.0–0.7)
EOS PCT: 2 % (ref 0–5)
HEMATOCRIT: 37.2 % — AB (ref 39.0–52.0)
Hemoglobin: 12.1 g/dL — ABNORMAL LOW (ref 13.0–17.0)
LYMPHS ABS: 1.4 10*3/uL (ref 0.7–4.0)
Lymphocytes Relative: 33 % (ref 12–46)
MCH: 27.2 pg (ref 26.0–34.0)
MCHC: 32.5 g/dL (ref 30.0–36.0)
MCV: 83.6 fL (ref 78.0–100.0)
MONO ABS: 0.3 10*3/uL (ref 0.1–1.0)
Monocytes Relative: 8 % (ref 3–12)
Neutro Abs: 2.4 10*3/uL (ref 1.7–7.7)
Neutrophils Relative %: 56 % (ref 43–77)
Platelets: 237 10*3/uL (ref 150–400)
RBC: 4.45 MIL/uL (ref 4.22–5.81)
RDW: 12.6 % (ref 11.5–15.5)
WBC: 4.3 10*3/uL (ref 4.0–10.5)

## 2015-03-22 LAB — FERRITIN: FERRITIN: 91 ng/mL (ref 24–336)

## 2015-03-22 NOTE — Progress Notes (Signed)
DIAGNOSIS:Stage IIA CRC Hemicolectomy on 02/07/2013  CURRENT THERAPY: surveillance  INTERVAL HISTORY: Jackson Bowen 64 y.o. 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  He is doing well with no complaints. He hasn't had a colonoscopy since his surgery. He is eating and sleeping well. He reports soreness on his left wrist. He is able to use the hand and denies any injury. He denies a family history of arthritis. His morning blood glucose level is around 105 mg/dL.  He denies weight loss or change in his bowel habits. No abdominal pain.  MEDICAL HISTORY: Past Medical History  Diagnosis Date  . Diabetes mellitus without complication   . Hypertension   . High blood cholesterol   . Colon cancer   . Colon cancer 02/24/2014    has Colon cancer on his problem list.     has No Known Allergies.  Current Outpatient Prescriptions on File Prior to Visit  Medication Sig Dispense Refill  . IRON PO Take 65 mg by mouth 2 (two) times daily.     Marland Kitchen LANTUS SOLOSTAR 100 UNIT/ML SOPN Inject 4.5 Units into the skin daily.    Marland Kitchen losartan-hydrochlorothiazide (HYZAAR) 100-12.5 MG per tablet Take 1 tablet by mouth daily.    Marland Kitchen lovastatin (MEVACOR) 40 MG tablet Take 1 tablet by mouth at bedtime.    . metFORMIN (GLUCOPHAGE) 1000 MG tablet Take 1,000 mg by mouth 2 (two) times daily.    Marland Kitchen UNABLE TO FIND Take 1 capsule by mouth daily. Med Name: [place] Plus Dietary Supplement    . fluticasone (FLONASE) 50 MCG/ACT nasal spray Place into the nose as needed.     No current facility-administered medications on file prior to visit.     SURGICAL HISTORY: Past Surgical History  Procedure Laterality Date  . Back surgery      for ruptured disc  . Carpal tunnel release      left hand  . Removal of cyst      right face  . Segmental colectomy     02/07/2013    SOCIAL HISTORY: History   Social History  . Marital Status: Married    Spouse Name: N/A  . Number of Children: N/A  . Years of Education: N/A   Occupational History  . Not on file.   Social History Main Topics  . Smoking status: Former Smoker -- 1.00 packs/day for 15 years    Types: Cigarettes  . Smokeless tobacco: Never Used  . Alcohol Use: No  . Drug Use: No  . Sexual Activity: Not on file   Other Topics Concern  . Not on file   Social History Narrative    FAMILY HISTORY: Family History  Problem Relation Age of Onset  . Diabetes Mother   . Stroke Father   . Cancer Sister   1 brother and 2 sisters. 1 daughters and 3 grandsons.  Review of Systems  Constitutional: Negative for fever, chills, weight loss and malaise/fatigue.  HENT: Negative for congestion, hearing loss, nosebleeds, sore throat and tinnitus.   Eyes: Negative for blurred vision, double vision, pain and discharge.  Respiratory: Negative for cough, hemoptysis, sputum production, shortness of breath and wheezing.   Cardiovascular: Negative for chest pain, palpitations, claudication, leg swelling and PND.  Gastrointestinal: Negative for heartburn, nausea, vomiting, abdominal pain, diarrhea, constipation, blood in stool and melena.  Genitourinary: Negative for dysuria,  urgency, frequency and hematuria.  Musculoskeletal: Positive for joint pain. Negative for myalgias and falls.  Skin: Negative for itching and rash.  Neurological: Negative for dizziness, tingling, tremors, sensory change, speech change, focal weakness, seizures, loss of consciousness, weakness and headaches.  Endo/Heme/Allergies: Does not bruise/bleed easily.  Psychiatric/Behavioral: Negative for depression, suicidal ideas, memory loss and substance abuse. The patient is not nervous/anxious and does not have insomnia.     PHYSICAL EXAMINATION  ECOG PERFORMANCE STATUS: 0 - Asymptomatic  Filed Vitals:   03/22/15 1337  BP:  122/61  Pulse: 77  Temp: 98.4 F (36.9 C)  Resp: 16    Physical Exam  Constitutional: He is oriented to person, place, and time and well-developed, well-nourished, and in no distress.  HENT:  Head: Normocephalic and atraumatic.  Nose: Nose normal.  Mouth/Throat: Oropharynx is clear and moist. No oropharyngeal exudate.  Eyes: Conjunctivae and EOM are normal. Pupils are equal, round, and reactive to light. Right eye exhibits no discharge. Left eye exhibits no discharge. No scleral icterus.  Neck: Normal range of motion. Neck supple. No tracheal deviation present. No thyromegaly present.  Cardiovascular: Normal rate, regular rhythm and normal heart sounds.  Exam reveals no gallop and no friction rub.   No murmur heard. Pulmonary/Chest: Effort normal and breath sounds normal. He has no wheezes. He has no rales.  Abdominal: Soft. Bowel sounds are normal. He exhibits no distension and no mass. There is no tenderness. There is no rebound and no guarding.  Musculoskeletal: Normal range of motion. He exhibits no edema.       Left wrist: He exhibits tenderness.  No wrist swelling or erythema. Normal ROM   Lymphadenopathy:    He has no cervical adenopathy.  Neurological: He is alert and oriented to person, place, and time. He has normal reflexes. No cranial nerve deficit. Gait normal. Coordination normal.  Skin: Skin is warm and dry. No rash noted.  Psychiatric: Mood, memory, affect and judgment normal.  Nursing note and vitals reviewed.   LABORATORY DATA:  CBC    Component Value Date/Time   WBC 4.3 03/22/2015 1326   RBC 4.45 03/22/2015 1326   HGB 12.1* 03/22/2015 1326   HCT 37.2* 03/22/2015 1326   PLT 237 03/22/2015 1326   MCV 83.6 03/22/2015 1326   MCH 27.2 03/22/2015 1326   MCHC 32.5 03/22/2015 1326   RDW 12.6 03/22/2015 1326   LYMPHSABS 1.4 03/22/2015 1326   MONOABS 0.3 03/22/2015 1326   EOSABS 0.1 03/22/2015 1326   BASOSABS 0.0 03/22/2015 1326   CMP     Component Value  Date/Time   NA 136 03/22/2015 1326   K 3.6 03/22/2015 1326   CL 99* 03/22/2015 1326   CO2 28 03/22/2015 1326   GLUCOSE 280* 03/22/2015 1326   BUN 11 03/22/2015 1326   CREATININE 1.11 03/22/2015 1326   CALCIUM 9.2 03/22/2015 1326   PROT 7.0 03/22/2015 1326   ALBUMIN 4.0 03/22/2015 1326   AST 28 03/22/2015 1326   ALT 20 03/22/2015 1326   ALKPHOS 66 03/22/2015 1326   BILITOT 0.9 03/22/2015 1326   GFRNONAA >60 03/22/2015 1326   GFRAA >60 03/22/2015 1326    RADIOGRAPHIC STUDIES:  CLINICAL DATA: Followup stage II colon carcinoma.  EXAM: CT ABDOMEN AND PELVIS WITH CONTRAST  IMPRESSION: No evidence of recurrent or metastatic carcinoma within the abdomen or pelvis.  Moderate to severe hepatic steatosis.   Electronically Signed  By: Earle Gell M.D.  On: 03/07/2014 09:42    ASSESSMENT and  THERAPY PLAN:  Stage IIA CRC Fatty Liver   He received no adjuvant therapy. I am unable to currently locate the results of microsatellite testing. He did not have high risk features per his available path report. He is due for repeat CT imaging with his last studies being in 03/2014. In addition, he is overdue for a colonoscopy as his last one was at the time of diagnosis. I have referred him back to GI. He needs follow-up in regards to his fatty liver as well.  We will order his CT scans and advise him of the results. I will see him back in 6 months with repeat labs and physical exam.  Orders Placed This Encounter  Procedures  . CBC with Differential    Standing Status: Future     Number of Occurrences:      Standing Expiration Date: 03/21/2016  . Comprehensive metabolic panel    Standing Status: Future     Number of Occurrences:      Standing Expiration Date: 03/21/2016  . Ferritin    Standing Status: Future     Number of Occurrences:      Standing Expiration Date: 03/21/2016  . Vitamin B12    Standing Status: Future     Number of Occurrences:      Standing Expiration  Date: 03/21/2016  . Folate    Standing Status: Future     Number of Occurrences:      Standing Expiration Date: 03/21/2016  . Reticulocytes    Standing Status: Future     Number of Occurrences:      Standing Expiration Date: 03/21/2016  . CEA    Standing Status: Future     Number of Occurrences:      Standing Expiration Date: 03/21/2016    We will schedule a colonoscopy. He will get his yearly exams and preventative care testing in June when he has time off from his job. Follow up in 6 months and we will recheck his iron levels at that time. Recommend using a brace for his left wrist soreness.  All questions were answered. The patient knows to call the clinic with any problems, questions or concerns. We can certainly see the patient much sooner if necessary. This note was electronically signed.  This document serves as a record of services personally performed by Ancil Linsey, MD. It was created on her behalf by Pearlie Oyster, a trained medical scribe. The creation of this record is based on the scribe's personal observations and the provider's statements to them. This document has been checked and approved by the attending provider.    I have reviewed the above documentation for accuracy and completeness, and I agree with the above.  Kelby Fam. Ellison Leisure MD

## 2015-03-22 NOTE — Progress Notes (Signed)
Labs drawn

## 2015-03-22 NOTE — Patient Instructions (Signed)
[place] at [facility] Discharge Instructions  RECOMMENDATIONS MADE BY THE CONSULTANT AND ANY TEST RESULTS WILL BE SENT TO YOUR REFERRING PHYSICIAN.  Exam and discussion by Dr. Whitney Muse Will make a referral for GI follow-up. Call with unexplained weight loss, blood in your bowel movement or other concerns.  Follow-up in 6 months with labs and office visit.  Thank you for choosing [place] at [facility] to provide your oncology and hematology care.  To afford each patient quality time with our provider, please arrive at least 15 minutes before your scheduled appointment time.    You need to re-schedule your appointment should you arrive 10 or more minutes late.  We strive to give you quality time with our providers, and arriving late affects you and other patients whose appointments are after yours.  Also, if you no show three or more times for appointments you may be dismissed from the clinic at the providers discretion.     Again, thank you for choosing [facility].  Our hope is that these requests will decrease the amount of time that you wait before being seen by our physicians.       _____________________________________________________________  Should you have questions after your visit to [facility], please contact our office at [phone] between the hours of 8:30 a.m. and 4:30 p.m.  Voicemails left after 4:30 p.m. will not be returned until the following business day.  For prescription refill requests, have your pharmacy contact our office.

## 2015-03-23 LAB — CEA: CEA: 3.9 ng/mL (ref 0.0–4.7)

## 2015-04-02 ENCOUNTER — Encounter (HOSPITAL_COMMUNITY): Payer: Self-pay | Admitting: Hematology & Oncology

## 2015-04-03 ENCOUNTER — Encounter: Payer: Self-pay | Admitting: Nurse Practitioner

## 2015-04-12 ENCOUNTER — Ambulatory Visit (HOSPITAL_COMMUNITY)
Admission: RE | Admit: 2015-04-12 | Discharge: 2015-04-12 | Disposition: A | Discharge status: Home / Self Care | Payer: 59 | Source: Ambulatory Visit | Attending: Hematology & Oncology | Admitting: Hematology & Oncology

## 2015-04-12 DIAGNOSIS — Z87891 Personal history of nicotine dependence: Secondary | ICD-10-CM | POA: Insufficient documentation

## 2015-04-12 DIAGNOSIS — Z794 Long term (current) use of insulin: Secondary | ICD-10-CM | POA: Insufficient documentation

## 2015-04-12 DIAGNOSIS — Z85038 Personal history of other malignant neoplasm of large intestine: Secondary | ICD-10-CM | POA: Diagnosis not present

## 2015-04-12 DIAGNOSIS — E119 Type 2 diabetes mellitus without complications: Secondary | ICD-10-CM | POA: Diagnosis not present

## 2015-04-12 DIAGNOSIS — R590 Localized enlarged lymph nodes: Secondary | ICD-10-CM | POA: Diagnosis not present

## 2015-04-12 DIAGNOSIS — D649 Anemia, unspecified: Secondary | ICD-10-CM

## 2015-04-12 DIAGNOSIS — E780 Pure hypercholesterolemia: Secondary | ICD-10-CM | POA: Insufficient documentation

## 2015-04-12 DIAGNOSIS — I10 Essential (primary) hypertension: Secondary | ICD-10-CM | POA: Insufficient documentation

## 2015-04-12 DIAGNOSIS — K429 Umbilical hernia without obstruction or gangrene: Secondary | ICD-10-CM | POA: Diagnosis not present

## 2015-04-12 DIAGNOSIS — C189 Malignant neoplasm of colon, unspecified: Secondary | ICD-10-CM | POA: Diagnosis not present

## 2015-04-12 DIAGNOSIS — K760 Fatty (change of) liver, not elsewhere classified: Secondary | ICD-10-CM | POA: Diagnosis not present

## 2015-04-12 MED ORDER — IOHEXOL 300 MG/ML  SOLN
100.0000 mL | Freq: Once | INTRAMUSCULAR | Status: AC | PRN
  Administered 2015-04-12: 100 mL via INTRAVENOUS

## 2015-04-18 ENCOUNTER — Other Ambulatory Visit: Payer: Self-pay

## 2015-04-18 ENCOUNTER — Encounter: Payer: Self-pay | Admitting: Nurse Practitioner

## 2015-04-18 ENCOUNTER — Ambulatory Visit (INDEPENDENT_AMBULATORY_CARE_PROVIDER_SITE_OTHER): Payer: 59 | Admitting: Nurse Practitioner

## 2015-04-18 VITALS — BP 123/67 | HR 57 | Temp 98.3°F | Ht 71.0 in | Wt 195.2 lb

## 2015-04-18 DIAGNOSIS — Z85038 Personal history of other malignant neoplasm of large intestine: Secondary | ICD-10-CM

## 2015-04-18 DIAGNOSIS — K760 Fatty (change of) liver, not elsewhere classified: Secondary | ICD-10-CM | POA: Diagnosis not present

## 2015-04-18 DIAGNOSIS — C189 Malignant neoplasm of colon, unspecified: Secondary | ICD-10-CM | POA: Diagnosis not present

## 2015-04-18 MED ORDER — PEG 3350-KCL-NA BICARB-NACL 420 G PO SOLR: 4000.0000 mL | Freq: Once | ORAL | Status: DC

## 2015-04-18 NOTE — Assessment & Plan Note (Signed)
Patient with a history of stage II a colorectal cancer diagnosed in 2014 and subsequent hemicolectomy of the descending colon. No further treatment was required for his colon cancer. Is seen regularly by oncology who referred the patient to [place] for repeat colonoscopy as he has not had one since his diagnosis and surgery in 2014. He is generally asymptomatic from a GI standpoint. No red flag/warning signs or symptoms. States he feels great. We'll proceed with surveillance colonoscopy status post colon cancer and partial hemicolectomy of the descending colon.  Proceed with colonoscopy with Dr. Oneida Alar in the near future. The risks, benefits, and alternatives have been discussed in detail with the patient. They state understanding and desire to proceed.   The patient is not on any anticoagulants, antidepressants, chronic pain medicines, anxiolytics. Denies alcohol and drug use. Conscious sedation should be sufficient for the patient.

## 2015-04-18 NOTE — Progress Notes (Signed)
cc'd to pcp 

## 2015-04-18 NOTE — Assessment & Plan Note (Signed)
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 The patient states he has not been following his diet well, although he does exercise regularly. Also of note, his diabetes has not been well managed. He has a follow-up appointment with his primary care provider to make adjustments to his diabetes medications. Explained the idea of fatty liver to him, risk factors, and means of prevention for advancing disease. Discussed role of diet, exercise, and diabetes control. Of note, he is not generally obese, however he does carry his weight around his abdomen. We'll provide diet and exercise information. Currently all of his labs appear normal.

## 2015-04-18 NOTE — Patient Instructions (Addendum)
1. We will schedule your procedure for you. 2. Further recommendations to be based on results of your procedure. 3. Practice good diet, exercise, and diabetes control to prevent further complications from fatty liver. He can work with her primary care more closely on these recommendations.  Fatty Liver Fatty liver is the accumulation of fat in liver cells. It is also called hepatosteatosis or steatohepatitis. It is normal for your liver to contain some fat. If fat is more than 5 to 10% of your liver's weight, you have fatty liver.  There are often no symptoms (problems) for years while damage is still occurring. People often learn about their fatty liver when they have medical tests for other reasons. Fat can damage your liver for years or even decades without causing problems. When it becomes severe, it can cause fatigue, weight loss, weakness, and confusion. This makes you more likely to develop more serious liver problems. The liver is the largest organ in the body. It does a lot of work and often gives no warning signs when it is sick until late in a disease. The liver has many important jobs including:  Breaking down foods.  Storing vitamins, iron, and other minerals.  Making proteins.  Making bile for food digestion.  Breaking down many products including medications, alcohol and some poisons. CAUSES  There are a number of different conditions, medications, and poisons that can cause a fatty liver. Eating too many calories causes fat to build up in the liver. Not processing and breaking fats down normally may also cause this. Certain conditions, such as obesity, diabetes, and high triglycerides also cause this. Most fatty liver patients tend to be middle-aged and over weight.  Some causes of fatty liver are:  Alcohol over consumption.  Malnutrition.  Steroid use.  Valproic acid toxicity.  Obesity.  Cushing's syndrome.  Poisons.  Tetracycline in high  dosages.  Pregnancy.  Diabetes.  Hyperlipidemia.  Rapid weight loss. Some people develop fatty liver even having none of these conditions. SYMPTOMS  Fatty liver most often causes no problems. This is called asymptomatic.  It can be diagnosed with blood tests and also by a liver biopsy.  It is one of the most common causes of minor elevations of liver enzymes on routine blood tests.  Specialized Imaging of the liver using ultrasound, CT (computed tomography) scan, or MRI (magnetic resonance imaging) can suggest a fatty liver but a biopsy is needed to confirm it.  A biopsy involves taking a small sample of liver tissue. This is done by using a needle. It is then looked at under a microscope by a specialist. TREATMENT  It is important to treat the cause. Simple fatty liver without a medical reason may not need treatment.  Weight loss, fat restriction, and exercise in overweight patients produces inconsistent results but is worth trying.  Fatty liver due to alcohol toxicity may not improve even with stopping drinking.  Good control of diabetes may reduce fatty liver.  Lower your triglycerides through diet, medication or both.  Eat a balanced, healthy diet.  Increase your physical activity.  Get regular checkups from a liver specialist.  There are no medical or surgical treatments for a fatty liver or NASH, but improving your diet and increasing your exercise may help prevent or reverse some of the damage. PROGNOSIS  Fatty liver may cause no damage or it can lead to an inflammation of the liver. This is, called steatohepatitis. When it is linked to alcohol abuse, it is called  alcoholic steatohepatitis. It often is not linked to alcohol. It is then called nonalcoholic steatohepatitis, or NASH. Over time the liver may become scarred and hardened. This condition is called cirrhosis. Cirrhosis is serious and may lead to liver failure or cancer. NASH is one of the leading causes of  cirrhosis. About 10-20% of Americans have fatty liver and a smaller 2-5% has NASH. Document Released: 12/05/2005 Document Revised: 01/12/2012 Document Reviewed: 03/01/2014 [facility] Patient Information 2015 [place], [place]. This information is not intended to replace advice given to you by your health care provider. Make sure you discuss any questions you have with your health care provider.   Fat and Cholesterol Control Diet Fat and cholesterol levels in your blood and organs are influenced by your diet. High levels of fat and cholesterol may lead to diseases of the heart, small and large blood vessels, gallbladder, liver, and pancreas. CONTROLLING FAT AND CHOLESTEROL WITH DIET Although exercise and lifestyle factors are important, your diet is key. That is because certain foods are known to raise cholesterol and others to lower it. The goal is to balance foods for their effect on cholesterol and more importantly, to replace saturated and trans fat with other types of fat, such as monounsaturated fat, polyunsaturated fat, and omega-3 fatty acids. On average, a person should consume no more than 15 to 17 g of saturated fat daily. Saturated and trans fats are considered "bad" fats, and they will raise LDL cholesterol. Saturated fats are primarily found in animal products such as meats, butter, and cream. However, that does not mean you need to give up all your favorite foods. Today, there are good tasting, low-fat, low-cholesterol substitutes for most of the things you like to eat. Choose low-fat or nonfat alternatives. Choose round or loin cuts of red meat. These types of cuts are lowest in fat and cholesterol. Chicken (without the skin), fish, veal, and ground [place] breast are great choices. Eliminate fatty meats, such as hot dogs and salami. Even shellfish have little or no saturated fat. Have a 3 oz (85 g) portion when you eat lean meat, poultry, or fish. Trans fats are also called "partially  hydrogenated oils." They are oils that have been scientifically manipulated so that they are solid at room temperature resulting in a longer shelf life and improved taste and texture of foods in which they are added. Trans fats are found in stick margarine, some tub margarines, cookies, crackers, and baked goods.  When baking and cooking, oils are a great substitute for butter. The monounsaturated oils are especially beneficial since it is believed they lower LDL and raise HDL. The oils you should avoid entirely are saturated tropical oils, such as coconut and palm.  Remember to eat a lot from food groups that are naturally free of saturated and trans fat, including fish, fruit, vegetables, beans, grains (barley, rice, couscous, bulgur wheat), and pasta (without cream sauces).  IDENTIFYING FOODS THAT LOWER FAT AND CHOLESTEROL  Soluble fiber may lower your cholesterol. This type of fiber is found in fruits such as apples, vegetables such as broccoli, potatoes, and carrots, legumes such as beans, peas, and lentils, and grains such as barley. Foods fortified with plant sterols (phytosterol) may also lower cholesterol. You should eat at least 2 g per day of these foods for a cholesterol lowering effect.  Read package labels to identify low-saturated fats, trans fat free, and low-fat foods at the supermarket. Select cheeses that have only 2 to 3 g saturated fat per ounce.  Use a heart-healthy tub margarine that is free of trans fats or partially hydrogenated oil. When buying baked goods (cookies, crackers), avoid partially hydrogenated oils. Breads and muffins should be made from whole grains (whole-wheat or whole oat flour, instead of "flour" or "enriched flour"). Buy non-creamy canned soups with reduced salt and no added fats.  FOOD PREPARATION TECHNIQUES  Never deep-fry. If you must fry, either stir-fry, which uses very little fat, or use non-stick cooking sprays. When possible, broil, bake, or roast meats, and  steam vegetables. Instead of putting butter or margarine on vegetables, use lemon and herbs, applesauce, and cinnamon (for squash and sweet potatoes). Use nonfat yogurt, salsa, and low-fat dressings for salads.  LOW-SATURATED FAT / LOW-FAT FOOD SUBSTITUTES Meats / Saturated Fat (g)  Avoid: Steak, marbled (3 oz/85 g) / 11 g  Choose: Steak, lean (3 oz/85 g) / 4 g  Avoid: Hamburger (3 oz/85 g) / 7 g  Choose: Hamburger, lean (3 oz/85 g) / 5 g  Avoid: Ham (3 oz/85 g) / 6 g  Choose: Ham, lean cut (3 oz/85 g) / 2.4 g  Avoid: Chicken, with skin, dark meat (3 oz/85 g) / 4 g  Choose: Chicken, skin removed, dark meat (3 oz/85 g) / 2 g  Avoid: Chicken, with skin, light meat (3 oz/85 g) / 2.5 g  Choose: Chicken, skin removed, light meat (3 oz/85 g) / 1 g Dairy / Saturated Fat (g)  Avoid: Whole milk (1 cup) / 5 g  Choose: Low-fat milk, 2% (1 cup) / 3 g  Choose: Low-fat milk, 1% (1 cup) / 1.5 g  Choose: Skim milk (1 cup) / 0.3 g  Avoid: Hard cheese (1 oz/28 g) / 6 g  Choose: Skim milk cheese (1 oz/28 g) / 2 to 3 g  Avoid: Cottage cheese, 4% fat (1 cup) / 6.5 g  Choose: Low-fat cottage cheese, 1% fat (1 cup) / 1.5 g  Avoid: Ice cream (1 cup) / 9 g  Choose: Sherbet (1 cup) / 2.5 g  Choose: Nonfat frozen yogurt (1 cup) / 0.3 g  Choose: Frozen fruit bar / trace  Avoid: Whipped cream (1 tbs) / 3.5 g  Choose: Nondairy whipped topping (1 tbs) / 1 g Condiments / Saturated Fat (g)  Avoid: Mayonnaise (1 tbs) / 2 g  Choose: Low-fat mayonnaise (1 tbs) / 1 g  Avoid: Butter (1 tbs) / 7 g  Choose: Extra light margarine (1 tbs) / 1 g  Avoid: Coconut oil (1 tbs) / 11.8 g  Choose: Olive oil (1 tbs) / 1.8 g  Choose: Corn oil (1 tbs) / 1.7 g  Choose: Safflower oil (1 tbs) / 1.2 g  Choose: Sunflower oil (1 tbs) / 1.4 g  Choose: Soybean oil (1 tbs) / 2.4 g  Choose: Canola oil (1 tbs) / 1 g Document Released: 10/20/2005 Document Revised: 02/14/2013 Document Reviewed:  01/18/2014 [employer] Patient Information 2015 [place], [place]. This information is not intended to replace advice given to you by your health care provider. Make sure you discuss any questions you have with your health care provider.   Diabetes Mellitus and Food It is important for you to manage your blood sugar (glucose) level. Your blood glucose level can be greatly affected by what you eat. Eating healthier foods in the appropriate amounts throughout the day at about the same time each day will help you control your blood glucose level. It can also help slow or prevent worsening of your diabetes mellitus. Healthy  eating may even help you improve the level of your blood pressure and reach or maintain a healthy weight.  HOW CAN FOOD AFFECT ME? Carbohydrates Carbohydrates affect your blood glucose level more than any other type of food. Your dietitian will help you determine how many carbohydrates to eat at each meal and teach you how to count carbohydrates. Counting carbohydrates is important to keep your blood glucose at a healthy level, especially if you are using insulin or taking certain medicines for diabetes mellitus. Alcohol Alcohol can cause sudden decreases in blood glucose (hypoglycemia), especially if you use insulin or take certain medicines for diabetes mellitus. Hypoglycemia can be a life-threatening condition. Symptoms of hypoglycemia (sleepiness, dizziness, and disorientation) are similar to symptoms of having too much alcohol.  If your health care provider has given you approval to drink alcohol, do so in moderation and use the following guidelines:  Women should not have more than one drink per day, and men should not have more than two drinks per day. One drink is equal to:  12 oz of beer.  5 oz of wine.  1 oz of hard liquor.  Do not drink on an empty stomach.  Keep yourself hydrated. Have water, diet soda, or unsweetened iced tea.  Regular soda, juice, and other mixers  might contain a lot of carbohydrates and should be counted. WHAT FOODS ARE NOT RECOMMENDED? As you make food choices, it is important to remember that all foods are not the same. Some foods have fewer nutrients per serving than other foods, even though they might have the same number of calories or carbohydrates. It is difficult to get your body what it needs when you eat foods with fewer nutrients. Examples of foods that you should avoid that are high in calories and carbohydrates but low in nutrients include:  Trans fats (most processed foods list trans fats on the Nutrition Facts label).  Regular soda.  Juice.  Candy.  Sweets, such as cake, pie, doughnuts, and cookies.  Fried foods. WHAT FOODS CAN I EAT? Have nutrient-rich foods, which will nourish your body and keep you healthy. The food you should eat also will depend on several factors, including:  The calories you need.  The medicines you take.  Your weight.  Your blood glucose level.  Your blood pressure level.  Your cholesterol level. You also should eat a variety of foods, including:  Protein, such as meat, poultry, fish, tofu, nuts, and seeds (lean animal proteins are best).  Fruits.  Vegetables.  Dairy products, such as milk, cheese, and yogurt (low fat is best).  Breads, grains, pasta, cereal, rice, and beans.  Fats such as olive oil, trans fat-free margarine, canola oil, avocado, and olives. DOES EVERYONE WITH DIABETES MELLITUS HAVE THE SAME MEAL PLAN? Because every person with diabetes mellitus is different, there is not one meal plan that works for everyone. It is very important that you meet with a dietitian who will help you create a meal plan that is just right for you. Document Released: 07/17/2005 Document Revised: 10/25/2013 Document Reviewed: 09/16/2013 [facility] Patient Information 2015 [place], [place]. This information is not intended to replace advice given to you by your health care provider.  Make sure you discuss any questions you have with your health care provider.   Diabetes, Eating Away From Home Sometimes, you might eat in a restaurant or have meals that are prepared by someone else. You can enjoy eating out. However, the portions in restaurants may be much larger  than needed. Listed below are some ideas to help you choose foods that will keep your blood glucose (sugar) in better control.  TIPS FOR EATING OUT  Know your meal plan and how many carbohydrate servings you should have at each meal. You may wish to carry a copy of your meal plan in your purse or wallet. Learn the foods included in each food group.  Make a list of restaurants near you that offer healthy choices. Take a copy of the carry-out menus to see what they offer. Then, you can plan what you will order ahead of time.  Become familiar with serving sizes by practicing them at home using measuring cups and spoons. Once you learn to recognize portion sizes, you will be able to correctly estimate the amount of total carbohydrate you are allowed to eat at the restaurant. Ask for a takeout box if the portion is more than you should have. When your food comes, leave the amount you should have on the plate, and put the rest in the takeout box before you start eating.  Plan ahead if your mealtime will be different from usual. Check with your caregiver to find out how to time meals and medicine if you are taking insulin.  Avoid high-fat foods, such as fried foods, cream sauces, high-fat salad dressings, or any added butter or margarine.  Do not be afraid to ask questions. Ask your server about the portion size, cooking methods, ingredients and if items can be substituted. Restaurants do not list all available items on the menu. You can ask for your main entree to be prepared using skim milk, oil instead of butter or margarine, and without gravy or sauces. Ask your waiter or waitress to serve salad dressings, gravy, sauces,  margarine, and sour cream on the side. You can then add the amount your meal plan suggests.  Add more vegetables whenever possible.  Avoid items that are labeled "jumbo," "giant," "deluxe," or "supersized."  You may want to split an entre with someone and order an extra side salad.  Watch for hidden calories in foods like croutons, bacon, or cheese.  Ask your server to take away the bread basket or chips from your table.  Order a dinner salad as an appetizer. You can eat most foods served in a restaurant. Some foods are better choices than others. Breads and Starches  Recommended: All kinds of bread (wheat, rye, white, oatmeal, [place], [place], raisin), hard or soft dinner rolls, frankfurter or hamburger buns, small bagels, small corn or whole-wheat flour tortillas.  Avoid: Frosted or glazed breads, butter rolls, egg or cheese breads, croissants, sweet rolls, pastries, coffee cake, glazed or frosted doughnuts, muffins. Crackers  Recommended: Animal crackers, graham, rye, saltine, oyster, and matzoth crackers. Bread sticks, melba toast, rusks, pretzels, popcorn (without fat), zwieback toast.  Avoid: High-fat snack crackers or chips. Buttered popcorn. Cereals  Recommended: Hot and cold cereals. Whole grains such as oatmeal or shredded wheat are good choices.  Avoid: Sugar-coated or granola type cereals. Potatoes/Pasta/Rice/Beans  Recommended: Order baked, boiled, or mashed potatoes, rice or noodles without added fat, whole beans. Order gravies, butter, margarine, or sauces on the side so you can control the amount you add.  Avoid: Hash browns or fried potatoes. Potatoes, pasta, or rice prepared with cream or cheese sauce. Potato or pasta salads prepared with large amounts of dressing. Fried beans or fried rice. Vegetables  Recommended: Order steamed, baked, boiled, or stewed vegetables without sauces or extra fat. Ask that sauce  be served on the side. If vegetables are not listed  on the menu, ask what is available.  Avoid: Vegetables prepared with cream, butter, or cheese sauce. Fried vegetables. Salad Bars  Recommended: Many of the vegetables at a salad bar are considered "free." Use lemon juice, vinegar, or low-calorie salad dressing (fewer than 20 calories per serving) as "free" dressings for your salad. Look for salad bar ingredients that have no added fat or sugar such as tomatoes, lettuce, cucumbers, broccoli, carrots, onions, and mushrooms.  Avoid: Prepared salads with large amounts of dressing, such as coleslaw, caesar salad, macaroni salad, bean salad, or carrot salad. Fruit  Recommended: Eat fresh fruit or fresh fruit salad without added dressing. A salad bar often offers fresh fruit choices, but canned fruit at a restaurant is usually packed in sugar or syrup.  Avoid: Sweetened canned or frozen fruits, plain or sweetened fruit juice. Fruit salads with dressing, sour cream, or sugar added to them. Meat and Meat Substitutes  Recommended: Order broiled, baked, roasted, or grilled meat, poultry, or fish. Trim off all visible fat. Do not eat the skin of poultry. The size stated on the menu is the raw weight. Meat shrinks by  in cooking (for example, 4 oz raw equals 3 oz cooked meat).  Avoid: Deep-fat fried meat, poultry, or fish. Breaded meats. Eggs  Recommended: Order soft, hard-cooked, poached, or scrambled eggs. Omelets may be okay, depending on what ingredients are added. Egg substitutes are also a good choice.  Avoid: Fried eggs, eggs prepared with cream or cheese sauce. Milk  Recommended: Order low-fat or fat-free milk according to your meal plan. Plain, nonfat yogurt or flavored yogurt with no sugar added may be used as a substitute for milk. Soy milk may also be used.  Avoid: Milk shakes or sweetened milk beverages. Soups and Combination Foods  Recommended: Clear broth or consomm are "free" foods and may be used as an appetizer. Broth-based soups  with fat removed count as a starch serving and are preferred over cream soups. Soups made with beans or split peas may be eaten but count as a starch.  Avoid: Fatty soups, soup made with cream, cheese soup. Combination foods prepared with excessive amounts of fat or with cream or cheese sauces. Desserts and Sweets  Recommended: Ask for fresh fruit. Sponge or angel food cake without icing, ice milk, no sugar added ice cream, sherbet, or frozen yogurt may fit into your meal plan occasionally.  Avoid: Pastries, puddings, pies, cakes with icing, custard, gelatin desserts. Fats and Oils  Recommended: Choose healthy fats such as olive oil, canola oil, or tub margarine, reduced fat or fat-free sour cream, cream cheese, avocado, or nuts.  Avoid: Any fats in excess of your allowed portion. Deep-fried foods or any food with a large amount of fat. Note: Ask for all fats to be served on the side, and limit your portion sizes according to your meal plan. Document Released: 10/20/2005 Document Revised: 01/12/2012 Document Reviewed: 01/17/2014 [facility] Patient Information 2015 [place], [place]. This information is not intended to replace advice given to you by your health care provider. Make sure you discuss any questions you have with your health care provider.

## 2015-04-18 NOTE — Progress Notes (Signed)
Primary Care Physician:  Molli Hazard, MD Primary Gastroenterologist:  Dr. Oneida Alar  Chief Complaint  Patient presents with  . Colonoscopy    HPI:   64 year old male presents on referral from oncology for evaluation of repeat colonoscopy. 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 He has not had a colonoscopy since his surgery. His initial colonoscopy which diagnosed his disease was done in [place], [place]. Also of note, CT performed by oncology demonstrated moderate to severe steatosis. Last labs done 03/22/2015 showed normal liver function on CMP, mild anemia stable and at patient baseline with a hemoglobin of 12.1 and hematocrit of 37.2. Per CT imaging 04/12/2015 no metastatic disease was found.  Today he states he's been doing great. Hasn't had any problems, works every day, does a lot of yard work. Walked 30-40 mins at least every other day. States his blood sugar has gotten out of control. Has a follow-up appointment with his PCP to attempt better control. Feels he's gotten off his good diet and was drinking sodas. He's just recently cut out sodas and whole milk. Denies abdominal pain, N/V, unintentional weight loss, hematochezia. Has dark stools but is on iron supplementation. Has a bowel movement about 3-4 times a day which are consistent with [place] 5-6. Denies chest pain, dyspnea, dizziness, lightheadedness, syncope, near syncope. Denies any other upper or lower GI symptoms.  Past Medical History  Diagnosis Date  . Diabetes mellitus without complication   . Hypertension   . High blood cholesterol   . Colon cancer   . Colon cancer 02/24/2014    Past Surgical History  Procedure Laterality Date  . Back surgery      for ruptured disc  . Carpal tunnel release      left hand  . Removal of cyst      right face  . Segmental colectomy     02/07/2013    Current Outpatient Prescriptions  Medication Sig Dispense Refill  . Fish Oil OIL Take 1 capsule by mouth every other day.    . IRON PO Take 65 mg by mouth 2 (two) times daily.     Marland Kitchen LANTUS SOLOSTAR 100 UNIT/ML SOPN Inject 4.5 Units into the skin daily.    Marland Kitchen losartan-hydrochlorothiazide (HYZAAR) 100-12.5 MG per tablet Take 1 tablet by mouth daily.    Marland Kitchen lovastatin (MEVACOR) 40 MG tablet Take 1 tablet by mouth at bedtime.    . metFORMIN (GLUCOPHAGE) 1000 MG tablet Take 1,000 mg by mouth 2 (two) times daily.    Marland Kitchen UNABLE TO FIND Take 1 capsule by mouth daily. Med Name: Muscadine Plus Dietary Supplement     No current facility-administered medications for this visit.    Allergies as of 04/18/2015  . (No Known Allergies)    Family History  Problem Relation Age of Onset  . Diabetes Mother   . Stroke Father   . Cancer Sister     History   Social History  . Marital Status: Married    Spouse Name: N/A  . Number of Children: N/A  . Years of Education: N/A   Occupational History  . Not on file.   Social History Main Topics  . Smoking status: Former Smoker -- 1.00 packs/day for 15 years    Types: Cigarettes  . Smokeless tobacco: Never Used  . Alcohol Use: No  . Drug Use: No  . Sexual Activity: Not  on file   Other Topics Concern  . Not on file   Social History Narrative    Review of Systems: 10 point ROS negative except as per noted in HPI.   Physical Exam: BP 123/67 mmHg  Pulse 57  Temp(Src) 98.3 F (36.8 C) (Oral)  Ht 5\' 11"  (1.803 m)  Wt 195 lb 3.2 oz (88.542 kg)  BMI 27.24 kg/m2 General:   Alert and oriented. Pleasant and cooperative. Well-nourished and well-developed.  Head:  Normocephalic and atraumatic. Eyes:  Without icterus, sclera clear and conjunctiva pink.  Ears:  Normal auditory acuity. Cardiovascular:  S1, S2 present without murmurs appreciated. Normal pulses noted. Extremities without clubbing or edema. Respiratory:  Clear to  auscultation bilaterally. No wheezes, rales, or rhonchi. No distress.  Gastrointestinal:  +BS, soft, non-tender and non-distended. No HSM noted. No guarding or rebound. No masses appreciated.  Rectal:  Deferred  Skin:  Intact without significant lesions or rashes. Anterior abdominal scars noted. Neurologic:  Alert and oriented x4;  grossly normal neurologically. Psych:  Alert and cooperative. Normal mood and affect. Heme/Lymph/Immune: No excessive bruising noted.    04/18/2015 9:10 AM

## 2015-04-25 ENCOUNTER — Other Ambulatory Visit (HOSPITAL_COMMUNITY): Payer: Self-pay | Admitting: Hematology & Oncology

## 2015-04-25 DIAGNOSIS — C189 Malignant neoplasm of colon, unspecified: Secondary | ICD-10-CM

## 2015-04-30 ENCOUNTER — Encounter (HOSPITAL_COMMUNITY): Payer: Self-pay | Admitting: *Deleted

## 2015-04-30 ENCOUNTER — Encounter (HOSPITAL_COMMUNITY)
Admission: RE | Disposition: A | Discharge status: Home / Self Care | Payer: Self-pay | Source: Ambulatory Visit | Attending: Gastroenterology

## 2015-04-30 ENCOUNTER — Ambulatory Visit (HOSPITAL_COMMUNITY)
Admission: RE | Admit: 2015-04-30 | Discharge: 2015-04-30 | Disposition: A | Discharge status: Home / Self Care | Payer: 59 | Source: Ambulatory Visit | Attending: Gastroenterology | Admitting: Gastroenterology

## 2015-04-30 DIAGNOSIS — Z1211 Encounter for screening for malignant neoplasm of colon: Secondary | ICD-10-CM | POA: Insufficient documentation

## 2015-04-30 DIAGNOSIS — Z79899 Other long term (current) drug therapy: Secondary | ICD-10-CM | POA: Diagnosis not present

## 2015-04-30 DIAGNOSIS — Z794 Long term (current) use of insulin: Secondary | ICD-10-CM | POA: Insufficient documentation

## 2015-04-30 DIAGNOSIS — E780 Pure hypercholesterolemia: Secondary | ICD-10-CM | POA: Insufficient documentation

## 2015-04-30 DIAGNOSIS — Z85038 Personal history of other malignant neoplasm of large intestine: Secondary | ICD-10-CM

## 2015-04-30 DIAGNOSIS — K648 Other hemorrhoids: Secondary | ICD-10-CM | POA: Diagnosis not present

## 2015-04-30 DIAGNOSIS — Z87891 Personal history of nicotine dependence: Secondary | ICD-10-CM | POA: Diagnosis not present

## 2015-04-30 DIAGNOSIS — Z9049 Acquired absence of other specified parts of digestive tract: Secondary | ICD-10-CM | POA: Insufficient documentation

## 2015-04-30 DIAGNOSIS — E119 Type 2 diabetes mellitus without complications: Secondary | ICD-10-CM | POA: Insufficient documentation

## 2015-04-30 DIAGNOSIS — I10 Essential (primary) hypertension: Secondary | ICD-10-CM | POA: Diagnosis not present

## 2015-04-30 DIAGNOSIS — K644 Residual hemorrhoidal skin tags: Secondary | ICD-10-CM | POA: Diagnosis not present

## 2015-04-30 HISTORY — PX: COLONOSCOPY: SHX5424

## 2015-04-30 LAB — GLUCOSE, CAPILLARY: GLUCOSE-CAPILLARY: 117 mg/dL — AB (ref 65–99)

## 2015-04-30 SURGERY — COLONOSCOPY
Anesthesia: Moderate Sedation

## 2015-04-30 MED ORDER — MEPERIDINE HCL 100 MG/ML IJ SOLN
INTRAMUSCULAR | Status: AC
  Filled 2015-04-30: qty 2

## 2015-04-30 MED ORDER — SODIUM CHLORIDE 0.9 % IV SOLN
INTRAVENOUS | Status: DC
  Administered 2015-04-30: 12:00:00 via INTRAVENOUS

## 2015-04-30 MED ORDER — MIDAZOLAM HCL 5 MG/5ML IJ SOLN
INTRAMUSCULAR | Status: AC
  Filled 2015-04-30: qty 10

## 2015-04-30 MED ORDER — MIDAZOLAM HCL 5 MG/5ML IJ SOLN
INTRAMUSCULAR | Status: DC | PRN
  Administered 2015-04-30 (×2): 2 mg via INTRAVENOUS

## 2015-04-30 MED ORDER — MEPERIDINE HCL 100 MG/ML IJ SOLN
INTRAMUSCULAR | Status: DC | PRN
  Administered 2015-04-30 (×2): 25 mg via INTRAVENOUS

## 2015-04-30 MED ORDER — STERILE WATER FOR IRRIGATION IR SOLN
Status: DC | PRN
  Administered 2015-04-30: 13:00:00

## 2015-04-30 NOTE — H&P (Signed)
  Primary Care Physician:  Molli Hazard, MD Primary Gastroenterologist:  Dr. Oneida Alar  Pre-Procedure History & Physical: HPI:  Jackson Bowen is a 64 y.o. male here for PERSONAL HISTORY OF COLON CANCER 2014.  Past Medical History  Diagnosis Date  . Diabetes mellitus without complication   . Hypertension   . High blood cholesterol   . Colon cancer   . Colon cancer 02/24/2014    Past Surgical History  Procedure Laterality Date  . Back surgery      for ruptured disc  . Carpal tunnel release      left hand  . Removal of cyst      right face  . Segmental colectomy      02/07/2013    Prior to Admission medications   Medication Sig Start Date End Date Taking? Authorizing Provider  Fish Oil OIL Take 1 capsule by mouth daily.    Yes Historical Provider, MD  IRON PO Take 65 mg by mouth 2 (two) times daily.    Yes Historical Provider, MD  LANTUS SOLOSTAR 100 UNIT/ML SOPN Inject 4.5 Units into the skin daily. 01/12/13  Yes Historical Provider, MD  losartan-hydrochlorothiazide (HYZAAR) 100-12.5 MG per tablet Take 1 tablet by mouth daily. 02/07/13  Yes Historical Provider, MD  lovastatin (MEVACOR) 40 MG tablet Take 1 tablet by mouth at bedtime. 12/01/12  Yes Historical Provider, MD  metFORMIN (GLUCOPHAGE) 1000 MG tablet Take 1,000 mg by mouth 2 (two) times daily. 12/01/12  Yes Historical Provider, MD  polyethylene glycol-electrolytes (NULYTELY/GOLYTELY) 420 G solution Take 4,000 mLs by mouth once. 04/18/15  Yes Carlis Stable, NP  UNABLE TO FIND Take 1 capsule by mouth daily. Med Name: Muscadine Plus Dietary Supplement   Yes Historical Provider, MD    Allergies as of 04/18/2015  . (No Known Allergies)    Family History  Problem Relation Age of Onset  . Diabetes Mother   . Stroke Father   . Cancer Sister   . Colon cancer Neg Hx     History   Social History  . Marital Status: Married    Spouse Name: N/A  . Number of Children: N/A  . Years of Education: N/A   Occupational  History  . Not on file.   Social History Main Topics  . Smoking status: Former Smoker -- 1.00 packs/day for 15 years    Types: Cigarettes    Quit date: 04/17/1990  . Smokeless tobacco: Never Used  . Alcohol Use: No  . Drug Use: No  . Sexual Activity: Not on file   Other Topics Concern  . Not on file   Social History Narrative    Review of Systems: See HPI, otherwise negative ROS   Physical Exam: BP 139/71 mmHg  Pulse 67  Temp(Src) 98.3 F (36.8 C) (Oral)  Resp 18  Ht 5\' 11"  (1.803 m)  Wt 195 lb (88.451 kg)  BMI 27.21 kg/m2  SpO2 97% General:   Alert,  pleasant and cooperative in NAD Head:  Normocephalic and atraumatic. Neck:  Supple; Lungs:  Clear throughout to auscultation.    Heart:  Regular rate and rhythm. Abdomen:  Soft, nontender and nondistended. Normal bowel sounds, without guarding, and without rebound.   Neurologic:  Alert and  oriented x4;  grossly normal neurologically.  Impression/Plan:     PERSONAL HISTORY OF COLON CANCER 2014.  PLAN: 1. TCS TODAY

## 2015-04-30 NOTE — Op Note (Signed)
[facility] [address] [place], [address]   COLONOSCOPY PROCEDURE REPORT  PATIENT: Jackson Bowen, Jackson Bowen  MR#: [id number] BIRTHDATE: 1951-04-07 , 35  yrs. old GENDER: male ENDOSCOPIST: Danie Binder, MD REFERRED FB:[id number] Penland, MD PROCEDURE DATE:  05/05/15 PROCEDURE:   Colonoscopy, screening INDICATIONS:high risk patient with personal history of colon cancer.  MEDICATIONS: Demerol 50 mg IV and Versed 4 mg IV  DESCRIPTION OF PROCEDURE:    Physical exam was performed.  Informed consent was obtained from the patient after explaining the benefits, risks, and alternatives to procedure.  The patient was connected to monitor and placed in left lateral position. Continuous oxygen was provided by nasal cannula and IV medicine administered through an indwelling cannula.  After administration of sedation and rectal exam, the patients rectum was intubated and the EC-[id number] ([id number])  colonoscope was advanced under direct visualization to the ileum.  The scope was removed slowly by carefully examining the color, texture, anatomy, and integrity mucosa on the way out.  The patient was recovered in endoscopy and discharged home in satisfactory condition. Estimated blood loss is zero unless otherwise noted in this procedure report.    COLON FINDINGS: The examined terminal ileum appeared to be normal. , The colonic mucosa appeared normal.  , and Moderate sized external and internal hemorrhoids were found.  PREP QUALITY: good.  CECAL W/D TIME: 11       minutes COMPLICATIONS: None  ENDOSCOPIC IMPRESSION: 1.   The examined terminal ileum appeared to be normal 2.   The colonic mucosa appeared normal 3.   Moderate sized external and internal hemorrhoids  RECOMMENDATIONS: FOLLOW A HIGH FIBER DIET.  AVOID ITEMS THAT CAUSE BLOATING. IF NEEDED, USE PREPARATION H 2 TO 4 TIMES A DAY FOR 10 DAYS TO RELIEVE RECTAL PRESSURE. Next colonoscopy in 3 years.  ALL SISTERS,  BROTHERS, CHILDREN, AND PARENTS NEED TO HAVE A COLONOSCOPY STARTING AT THE AGE OF 40.      _______________________________ eSignedDanie Binder, MD 2015/05/05 1:04 PM   CPT CODES: ICD CODES:  The ICD and CPT codes recommended by this software are interpretations from the data that the clinical staff has captured with the software.  The verification of the translation of this report to the ICD and CPT codes and modifiers is the sole responsibility of the health care institution and practicing physician where this report was generated.  [employer] will not be held responsible for the validity of the ICD and CPT codes included on this report.  AMA assumes no liability for data contained or not contained herein. [employer] is a Designer, television/film set of the [employer].

## 2015-04-30 NOTE — Discharge Instructions (Signed)
You have moderate internal AND EXTERNAL hemorrhoids. YOU DID NOT HAVE ANY POLYPS.   FOLLOW A HIGH FIBER DIET. AVOID ITEMS THAT CAUSE BLOATING. SEE INFO BELOW.  IF NEEDED, USE PREPARATION H 2 TO 4 TIMES A DAY FOR 10 DAYS TO RELIEVE RECTAL PRESSURE.  Next colonoscopy in 3 years. YOUR SISTERS, BROTHERS, CHILDREN, AND PARENTS NEED TO HAVE A COLONOSCOPY STARTING AT THE AGE OF 40.    Colonoscopy Care After Read the instructions outlined below and refer to this sheet in the next week. These discharge instructions provide you with general information on caring for yourself after you leave the hospital. While your treatment has been planned according to the most current medical practices available, unavoidable complications occasionally occur. If you have any problems or questions after discharge, call DR. Gladyce Mcray, [phone].  ACTIVITY  You may resume your regular activity, but move at a slower pace for the next 24 hours.   Take frequent rest periods for the next 24 hours.   Walking will help get rid of the air and reduce the bloated feeling in your belly (abdomen).   No driving for 24 hours (because of the medicine (anesthesia) used during the test).   You may shower.   Do not sign any important legal documents or operate any machinery for 24 hours (because of the anesthesia used during the test).    NUTRITION  Drink plenty of fluids.   You may resume your normal diet as instructed by your doctor.   Begin with a light meal and progress to your normal diet. Heavy or fried foods are harder to digest and may make you feel sick to your stomach (nauseated).   Avoid alcoholic beverages for 24 hours or as instructed.    MEDICATIONS  You may resume your normal medications.   WHAT YOU CAN EXPECT TODAY  Some feelings of bloating in the abdomen.   Passage of more gas than usual.   Spotting of blood in your stool or on the toilet paper  .  IF YOU HAD POLYPS REMOVED DURING THE  COLONOSCOPY:  Eat a soft diet IF YOU HAVE NAUSEA, BLOATING, ABDOMINAL PAIN, OR VOMITING.    FINDING OUT THE RESULTS OF YOUR TEST Not all test results are available during your visit. DR. Oneida Alar WILL CALL YOU WITHIN 7 DAYS OF YOUR PROCEDUE WITH YOUR RESULTS. Do not assume everything is normal if you have not heard from DR. Dayra Rapley IN ONE WEEK, CALL HER OFFICE AT [phone].  SEEK IMMEDIATE MEDICAL ATTENTION AND CALL THE OFFICE: [phone] IF:  You have more than a spotting of blood in your stool.   Your belly is swollen (abdominal distention).   You are nauseated or vomiting.   You have a temperature over 101F.   You have abdominal pain or discomfort that is severe or gets worse throughout the day.  High-Fiber Diet A high-fiber diet changes your normal diet to include more whole grains, legumes, fruits, and vegetables. Changes in the diet involve replacing refined carbohydrates with unrefined foods. The calorie level of the diet is essentially unchanged. The Dietary Reference Intake (recommended amount) for adult males is 38 grams per day. For adult females, it is 25 grams per day. Pregnant and lactating women should consume 28 grams of fiber per day. Fiber is the intact part of a plant that is not broken down during digestion. Functional fiber is fiber that has been isolated from the plant to provide a beneficial effect in the body. PURPOSE  Increase stool  bulk.   Ease and regulate bowel movements.   Lower cholesterol.   REDUCE RISK OF COLON CANCER  INDICATIONS THAT YOU NEED MORE FIBER  Constipation and hemorrhoids.   Uncomplicated diverticulosis (intestine condition) and irritable bowel syndrome.   Weight management.   As a protective measure against hardening of the arteries (atherosclerosis), diabetes, and cancer.   GUIDELINES FOR INCREASING FIBER IN THE DIET  Start adding fiber to the diet slowly. A gradual increase of about 5 more grams (2 slices of whole-wheat  bread, 2 servings of most fruits or vegetables, or 1 bowl of high-fiber cereal) per day is best. Too rapid an increase in fiber may result in constipation, flatulence, and bloating.   Drink enough water and fluids to keep your urine clear or pale yellow. Water, juice, or caffeine-free drinks are recommended. Not drinking enough fluid may cause constipation.   Eat a variety of high-fiber foods rather than one type of fiber.   Try to increase your intake of fiber through using high-fiber foods rather than fiber pills or supplements that contain small amounts of fiber.   The goal is to change the types of food eaten. Do not supplement your present diet with high-fiber foods, but replace foods in your present diet.   INCLUDE A VARIETY OF FIBER SOURCES  Replace refined and processed grains with whole grains, canned fruits with fresh fruits, and incorporate other fiber sources. White rice, white breads, and most bakery goods contain little or no fiber.   Brown whole-grain rice, buckwheat oats, and many fruits and vegetables are all good sources of fiber. These include: broccoli, Brussels sprouts, cabbage, cauliflower, beets, sweet potatoes, white potatoes (skin on), carrots, tomatoes, eggplant, squash, berries, fresh fruits, and dried fruits.   Cereals appear to be the richest source of fiber. Cereal fiber is found in whole grains and bran. Bran is the fiber-rich outer coat of cereal grain, which is largely removed in refining. In whole-grain cereals, the bran remains. In breakfast cereals, the largest amount of fiber is found in those with "bran" in their names. The fiber content is sometimes indicated on the label.   You may need to include additional fruits and vegetables each day.   In baking, for 1 cup white flour, you may use the following substitutions:   1 cup whole-wheat flour minus 2 tablespoons.   1/2 cup white flour plus 1/2 cup whole-wheat flour.   Hemorrhoids Hemorrhoids are  dilated (enlarged) veins around the rectum. Sometimes clots will form in the veins. This makes them swollen and painful. These are called thrombosed hemorrhoids. Causes of hemorrhoids include:  Constipation.   Straining to have a bowel movement.   HEAVY LIFTING  HOME CARE INSTRUCTIONS  Eat a well balanced diet and drink 6 to 8 glasses of water every day to avoid constipation. You may also use a bulk laxative.   Avoid straining to have bowel movements.   Keep anal area dry and clean.   Do not use a donut shaped pillow or sit on the toilet for long periods. This increases blood pooling and pain.   Move your bowels when your body has the urge; this will require less straining and will decrease pain and pressure.

## 2015-05-01 ENCOUNTER — Encounter (HOSPITAL_COMMUNITY): Payer: Self-pay | Admitting: Gastroenterology

## 2015-05-11 ENCOUNTER — Ambulatory Visit (HOSPITAL_COMMUNITY)
Admission: RE | Admit: 2015-05-11 | Discharge: 2015-05-11 | Disposition: A | Discharge status: Home / Self Care | Payer: 59 | Source: Ambulatory Visit | Attending: Hematology & Oncology | Admitting: Hematology & Oncology

## 2015-05-11 DIAGNOSIS — C189 Malignant neoplasm of colon, unspecified: Secondary | ICD-10-CM

## 2015-05-11 DIAGNOSIS — C19 Malignant neoplasm of rectosigmoid junction: Secondary | ICD-10-CM | POA: Diagnosis not present

## 2015-05-11 DIAGNOSIS — K760 Fatty (change of) liver, not elsewhere classified: Secondary | ICD-10-CM | POA: Insufficient documentation

## 2015-05-11 DIAGNOSIS — R918 Other nonspecific abnormal finding of lung field: Secondary | ICD-10-CM | POA: Diagnosis not present

## 2015-05-11 DIAGNOSIS — R911 Solitary pulmonary nodule: Secondary | ICD-10-CM | POA: Diagnosis present

## 2015-05-11 LAB — POCT I-STAT CREATININE: CREATININE: 1 mg/dL (ref 0.61–1.24)

## 2015-05-11 MED ORDER — IOHEXOL 300 MG/ML  SOLN
75.0000 mL | Freq: Once | INTRAMUSCULAR | Status: AC | PRN
  Administered 2015-05-11: 75 mL via INTRAVENOUS

## 2015-05-18 ENCOUNTER — Other Ambulatory Visit (HOSPITAL_COMMUNITY): Payer: Self-pay | Admitting: Hematology & Oncology

## 2015-05-18 DIAGNOSIS — R911 Solitary pulmonary nodule: Secondary | ICD-10-CM

## 2015-05-18 DIAGNOSIS — C189 Malignant neoplasm of colon, unspecified: Secondary | ICD-10-CM

## 2015-09-20 ENCOUNTER — Ambulatory Visit (HOSPITAL_COMMUNITY): Payer: Medicare Other

## 2015-09-24 ENCOUNTER — Ambulatory Visit (HOSPITAL_COMMUNITY): Payer: Self-pay | Admitting: Hematology & Oncology

## 2015-09-24 ENCOUNTER — Other Ambulatory Visit (HOSPITAL_COMMUNITY): Payer: Self-pay

## 2015-10-08 ENCOUNTER — Ambulatory Visit (HOSPITAL_COMMUNITY)
Admission: RE | Admit: 2015-10-08 | Discharge: 2015-10-08 | Disposition: A | Discharge status: Home / Self Care | Payer: Medicare Other | Source: Ambulatory Visit | Attending: Hematology & Oncology | Admitting: Hematology & Oncology

## 2015-10-08 DIAGNOSIS — R911 Solitary pulmonary nodule: Secondary | ICD-10-CM | POA: Diagnosis not present

## 2015-10-08 DIAGNOSIS — C189 Malignant neoplasm of colon, unspecified: Secondary | ICD-10-CM | POA: Insufficient documentation

## 2015-10-08 LAB — POCT I-STAT CREATININE: CREATININE: 1.1 mg/dL (ref 0.61–1.24)

## 2015-10-08 MED ORDER — IOHEXOL 300 MG/ML  SOLN
75.0000 mL | Freq: Once | INTRAMUSCULAR | Status: AC | PRN
  Administered 2015-10-08: 75 mL via INTRAVENOUS

## 2015-10-09 ENCOUNTER — Encounter (HOSPITAL_COMMUNITY): Payer: Self-pay

## 2015-10-19 NOTE — Progress Notes (Signed)
This encounter was created in error - please disregard.

## 2015-11-19 ENCOUNTER — Encounter (HOSPITAL_COMMUNITY): Payer: Medicare Other | Attending: Hematology & Oncology | Admitting: Hematology & Oncology

## 2015-11-19 ENCOUNTER — Encounter (HOSPITAL_COMMUNITY): Payer: Self-pay | Admitting: Hematology & Oncology

## 2015-11-19 VITALS — BP 122/61 | HR 64 | Temp 98.0°F | Resp 18 | Wt 195.1 lb

## 2015-11-19 DIAGNOSIS — C186 Malignant neoplasm of descending colon: Secondary | ICD-10-CM | POA: Diagnosis not present

## 2015-11-19 DIAGNOSIS — C189 Malignant neoplasm of colon, unspecified: Secondary | ICD-10-CM | POA: Diagnosis not present

## 2015-11-19 DIAGNOSIS — D649 Anemia, unspecified: Secondary | ICD-10-CM | POA: Diagnosis not present

## 2015-11-19 DIAGNOSIS — C182 Malignant neoplasm of ascending colon: Secondary | ICD-10-CM

## 2015-11-19 DIAGNOSIS — K760 Fatty (change of) liver, not elsewhere classified: Secondary | ICD-10-CM

## 2015-11-19 LAB — CBC WITH DIFFERENTIAL/PLATELET
Basophils Absolute: 0 10*3/uL (ref 0.0–0.1)
Basophils Relative: 1 %
Eosinophils Absolute: 0.1 10*3/uL (ref 0.0–0.7)
Eosinophils Relative: 2 %
HEMATOCRIT: 38.5 % — AB (ref 39.0–52.0)
HEMOGLOBIN: 12.8 g/dL — AB (ref 13.0–17.0)
LYMPHS ABS: 1.5 10*3/uL (ref 0.7–4.0)
Lymphocytes Relative: 31 %
MCH: 26.8 pg (ref 26.0–34.0)
MCHC: 33.2 g/dL (ref 30.0–36.0)
MCV: 80.7 fL (ref 78.0–100.0)
MONOS PCT: 10 %
Monocytes Absolute: 0.5 10*3/uL (ref 0.1–1.0)
NEUTROS ABS: 2.8 10*3/uL (ref 1.7–7.7)
NEUTROS PCT: 57 %
Platelets: 260 10*3/uL (ref 150–400)
RBC: 4.77 MIL/uL (ref 4.22–5.81)
RDW: 13 % (ref 11.5–15.5)
WBC: 4.9 10*3/uL (ref 4.0–10.5)

## 2015-11-19 LAB — FOLATE: FOLATE: 31.3 ng/mL (ref >5.9)

## 2015-11-19 LAB — RETICULOCYTES
RBC.: 4.49 MIL/uL (ref 4.22–5.81)
Retic Count, Absolute: 53.9 10*3/uL (ref 19.0–186.0)
Retic Ct Pct: 1.2 % (ref 0.4–3.1)

## 2015-11-19 LAB — VITAMIN B12: Vitamin B-12: 202 pg/mL (ref 180–914)

## 2015-11-19 LAB — COMPREHENSIVE METABOLIC PANEL
ALK PHOS: 63 U/L (ref 38–126)
ALT: 20 U/L (ref 17–63)
ANION GAP: 10 (ref 5–15)
AST: 27 U/L (ref 15–41)
Albumin: 4.4 g/dL (ref 3.5–5.0)
BILIRUBIN TOTAL: 1.1 mg/dL (ref 0.3–1.2)
BUN: 13 mg/dL (ref 6–20)
CO2: 26 mmol/L (ref 22–32)
Calcium: 9.6 mg/dL (ref 8.9–10.3)
Chloride: 103 mmol/L (ref 101–111)
Creatinine, Ser: 1.07 mg/dL (ref 0.61–1.24)
GLUCOSE: 97 mg/dL (ref 65–99)
POTASSIUM: 3.3 mmol/L — AB (ref 3.5–5.1)
Sodium: 139 mmol/L (ref 135–145)
Total Protein: 7.6 g/dL (ref 6.5–8.1)

## 2015-11-19 LAB — FERRITIN: Ferritin: 71 ng/mL (ref 24–336)

## 2015-11-19 NOTE — Patient Instructions (Addendum)
[place] at [facility] Discharge Instructions  RECOMMENDATIONS MADE BY THE CONSULTANT AND ANY TEST RESULTS WILL BE SENT TO YOUR REFERRING PHYSICIAN.   Exam completed by Dr Whitney Muse today Labs today Return to see the doctor in 6 months  Please call the clinic if you have any questions or concerns    Thank you for choosing [facility] to provide your oncology and hematology care.  To afford each patient quality time with our provider, please arrive at least 15 minutes before your scheduled appointment time.    You need to re-schedule your appointment should you arrive 10 or more minutes late.  We strive to give you quality time with our providers, and arriving late affects you and other patients whose appointments are after yours.  Also, if you no show three or more times for appointments you may be dismissed from the clinic at the providers discretion.     Again, thank you for choosing [facility].  Our hope is that these requests will decrease the amount of time that you wait before being seen by our physicians.       _____________________________________________________________  Should you have questions after your visit to [facility], please contact our office at [phone] between the hours of 8:30 a.m. and 4:30 p.m.  Voicemails left after 4:30 p.m. will not be returned until the following business day.  For prescription refill requests, have your pharmacy contact our office.

## 2015-11-19 NOTE — Progress Notes (Signed)
Jackson Bowen's reason for visit today is for labs as scheduled per MD orders.  Venipuncture performed with a 23 gauge butterfly needle to R Antecubital.  Jackson Bowen tolerated procedure well and without incident; questions were answered and patient was discharged.

## 2015-11-19 NOTE — Progress Notes (Signed)
DIAGNOSIS:Stage IIA CRC Hemicolectomy on 02/07/2013  CURRENT THERAPY: surveillance  INTERVAL HISTORY: Jackson Bowen 65 y.o. 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  Jackson Bowen returns to the [facility] alone today.  He has been a very conscientious patient and has had all of his scans, his colonoscopy, and his repeat scans, showing that whatever was in his lung is now gone. He reports that he feels good. He says "I know I'm old and can't walk or run as fast, but I'm all right." He says that his holidays were great; he tried not to eat too much, but says he probably did eat too much a couple of times, which he counteracted each time with an hour of walking. He remarks that he tries to walk every day at least 30-35 minutes on a daily basis.  He is on recall for his colonoscopy in 3 years.  He says that his wife has been sick with fibromyalgia and on a lot of medicine, and she usually takes care of his scheduling, so he can't remember what he's been scheduled for. He confirms that she's doing a little bit better now.  He denies any change in his bowels, or any blood in his stools. He takes iron so his stools are dark, and he says he "couldn't tell" if there was blood in there anyway.   In general, he says "I hadn't had no problems at all."  He is a big gardener so he has plans for that once the weather gets better. His weight is stable.   MEDICAL HISTORY: Past Medical History  Diagnosis Date  . Diabetes mellitus without complication ([place])   . Hypertension   . High blood cholesterol   . Colon cancer ([place])   . Colon cancer ([place]) 02/24/2014    has Colon cancer ([place]); History of colon cancer; and Steatosis of liver on his problem list.     has No Known Allergies.  Current Outpatient Prescriptions on File Prior to  Visit  Medication Sig Dispense Refill  . Fish Oil OIL Take 1 capsule by mouth daily.     . IRON PO Take 65 mg by mouth 2 (two) times daily.     Marland Kitchen LANTUS SOLOSTAR 100 UNIT/ML SOPN Inject 4.5 Units into the skin daily.    Marland Kitchen losartan-hydrochlorothiazide (HYZAAR) 100-12.5 MG per tablet Take 1 tablet by mouth daily.    Marland Kitchen lovastatin (MEVACOR) 40 MG tablet Take 1 tablet by mouth at bedtime.    . metFORMIN (GLUCOPHAGE) 1000 MG tablet Take 1,000 mg by mouth 2 (two) times daily.    . polyethylene glycol-electrolytes (NULYTELY/GOLYTELY) 420 G solution Take 4,000 mLs by mouth once. 4000 mL 0  . UNABLE TO FIND Take 1 capsule by mouth daily. Med Name: Muscadine Plus Dietary Supplement     No current facility-administered medications on file prior to visit.     SURGICAL HISTORY: Past Surgical History  Procedure Laterality Date  . Back surgery      for ruptured disc  . Carpal tunnel release      left hand  . Removal of cyst      right face  . Segmental colectomy      02/07/2013  . Colonoscopy N/A 04/30/2015    Procedure: COLONOSCOPY;  Surgeon: Danie Binder, MD;  [facility];  Service: Endoscopy;  Laterality:  N/A;  1245    SOCIAL HISTORY: Social History   Social History  . Marital Status: Married    Spouse Name: N/A  . Number of Children: N/A  . Years of Education: N/A   Occupational History  . Not on file.   Social History Main Topics  . Smoking status: Former Smoker -- 1.00 packs/day for 15 years    Types: Cigarettes    Quit date: 04/17/1990  . Smokeless tobacco: Never Used  . Alcohol Use: No  . Drug Use: No  . Sexual Activity: Not on file   Other Topics Concern  . Not on file   Social History Narrative    FAMILY HISTORY: Family History  Problem Relation Age of Onset  . Diabetes Mother   . Stroke Father   . Cancer Sister   . Colon cancer Neg Hx   1 brother and 2 sisters. 1 daughters and 3 grandsons.  Review of Systems  Constitutional: Negative for  fever, chills, weight loss and malaise/fatigue.  HENT: Negative for congestion, hearing loss, nosebleeds, sore throat and tinnitus.   Eyes: Negative for blurred vision, double vision, pain and discharge.  Respiratory: Negative for cough, hemoptysis, sputum production, shortness of breath and wheezing.   Cardiovascular: Negative for chest pain, palpitations, claudication, leg swelling and PND.  Gastrointestinal: Negative for heartburn, nausea, vomiting, abdominal pain, diarrhea, constipation, blood in stool and melena.  Genitourinary: Negative for dysuria, urgency, frequency and hematuria.  Musculoskeletal: Positive for joint pain. Negative for myalgias and falls.  Skin: Negative for itching and rash.  Neurological: Negative for dizziness, tingling, tremors, sensory change, speech change, focal weakness, seizures, loss of consciousness, weakness and headaches.  Endo/Heme/Allergies: Does not bruise/bleed easily.  Psychiatric/Behavioral: Negative for depression, suicidal ideas, memory loss and substance abuse. The patient is not nervous/anxious and does not have insomnia.    14 point review of systems was performed and is negative except as detailed under history of present illness and above   PHYSICAL EXAMINATION  ECOG PERFORMANCE STATUS: 0 - Asymptomatic  Filed Vitals:   11/19/15 1346  BP: 122/61  Pulse: 64  Temp: 98 F (36.7 C)  Resp: 18    Physical Exam  Constitutional: He is oriented to person, place, and time and well-developed, well-nourished, and in no distress.  HENT:  Head: Normocephalic and atraumatic.  Nose: Nose normal.  Mouth/Throat: Oropharynx is clear and moist. No oropharyngeal exudate.  Eyes: Conjunctivae and EOM are normal. Pupils are equal, round, and reactive to light. Right eye exhibits no discharge. Left eye exhibits no discharge. No scleral icterus.  Neck: Normal range of motion. Neck supple. No tracheal deviation present. No thyromegaly present.    Cardiovascular: Normal rate, regular rhythm and normal heart sounds.  Exam reveals no gallop and no friction rub.   No murmur heard. Pulmonary/Chest: Effort normal and breath sounds normal. He has no wheezes. He has no rales.  Abdominal: Soft. Bowel sounds are normal. He exhibits no distension and no mass. There is no tenderness. There is no rebound and no guarding.  Musculoskeletal: Normal range of motion. He exhibits no edema.  Lymphadenopathy:    He has no cervical adenopathy.  Neurological: He is alert and oriented to person, place, and time. He has normal reflexes. No cranial nerve deficit. Gait normal. Coordination normal.  Skin: Skin is warm and dry. No rash noted.  Psychiatric: Mood, memory, affect and judgment normal.  Nursing note and vitals reviewed.   LABORATORY DATA: I have reviewed the data  as listed.  CBC    Component Value Date/Time   WBC 4.3 03/22/2015 1326   RBC 4.45 03/22/2015 1326   HGB 12.1* 03/22/2015 1326   HCT 37.2* 03/22/2015 1326   PLT 237 03/22/2015 1326   MCV 83.6 03/22/2015 1326   MCH 27.2 03/22/2015 1326   MCHC 32.5 03/22/2015 1326   RDW 12.6 03/22/2015 1326   LYMPHSABS 1.4 03/22/2015 1326   MONOABS 0.3 03/22/2015 1326   EOSABS 0.1 03/22/2015 1326   BASOSABS 0.0 03/22/2015 1326   CMP     Component Value Date/Time   NA 136 03/22/2015 1326   K 3.6 03/22/2015 1326   CL 99* 03/22/2015 1326   CO2 28 03/22/2015 1326   GLUCOSE 280* 03/22/2015 1326   BUN 11 03/22/2015 1326   CREATININE 1.10 10/08/2015 0822   CALCIUM 9.2 03/22/2015 1326   PROT 7.0 03/22/2015 1326   ALBUMIN 4.0 03/22/2015 1326   AST 28 03/22/2015 1326   ALT 20 03/22/2015 1326   ALKPHOS 66 03/22/2015 1326   BILITOT 0.9 03/22/2015 1326   GFRNONAA >60 03/22/2015 1326   GFRAA >60 03/22/2015 1326    RADIOGRAPHIC STUDIES:   Study Result     CLINICAL DATA: Subsequent encounter for lung nodule  EXAM: CT CHEST WITH CONTRAST  TECHNIQUE: Multidetector CT imaging of the  chest was performed during intravenous contrast administration.  CONTRAST: 58m OMNIPAQUE IOHEXOL 300 MG/ML SOLN  COMPARISON: 05/11/2015  FINDINGS: Mediastinum / Lymph Nodes: No mediastinal lymphadenopathy. There is no hilar lymphadenopathy. The heart size is normal. No pericardial effusion. Coronary artery calcification is noted. The esophagus has normal imaging features.  Lungs / Pleura: The tiny ill-defined nodular areas in question seen previously in the right upper lobe have almost completely resolved in the interval with no measurable lesion on today's study. There is a very faint approximately 1 mm punctate focus at the location of 1 of the 2 nodules (see imagea 32 series 3). Posterior right upper lobe calcified granuloma persists. No new or enlarging pulmonary nodule. No focal airspace consolidation.  Upper Abdomen: The liver shows diffusely decreased attenuation suggesting steatosis.  MSK / Soft Tissues: Bone windows reveal no worrisome lytic or sclerotic osseous lesions.  IMPRESSION: 1. The tiny adjacent right upper lobe pulmonary nodules seen on the previous study have essentially resolved in the interval. One of the 2 has completely resolved and the other demonstrates only a tiny punctate nodule on today's study which is not measurable. Features previously were probably related to an infectious or inflammatory alveolitis.   Electronically Signed  By: EMisty StanleyM.D.  On: 10/08/2015 09:14     ASSESSMENT and THERAPY PLAN:  Stage IIA CRC Fatty Liver Pulmonary nodule, resolved on recent CT  He received no adjuvant therapy. I am unable to currently locate the results of microsatellite testing. Pathology will be contacted to locate. If needed I will refer him to genetics.  He did not have high risk features per his available path report. He has had repeat imaging. He is up to date on colonoscopy.  I will see him back in 6 months with repeat  labs and physical exam.  I recommend that he keep up his walking, as it offers a myriad of health benefits.  All questions were answered. The patient knows to call the clinic with any problems, questions or concerns. We can certainly see the patient much sooner if necessary. This note was electronically signed.  This document serves as a record of services personally performed  by Ancil Linsey, MD. It was created on her behalf by Toni Amend, a trained medical scribe. The creation of this record is based on the scribe's personal observations and the provider's statements to them. This document has been checked and approved by the attending provider.  I have reviewed the above documentation for accuracy and completeness, and I agree with the above.  Kelby Fam. Penland MD

## 2015-11-20 IMAGING — CT CT ABD-PELV W/ CM
2 of 4 series · 16 of 46 positions shown, 18 images · IV contrast (omnipaque)
Comparison: 03/07/2014

CLINICAL DATA: Stage IIA colorectal carcinoma, surveillance
imaging, prior surgery 7049, history hypertension, diabetes,
hypercholesterolemia, former smoker

EXAM:
CT ABDOMEN AND PELVIS WITH CONTRAST
TECHNIQUE: Multidetector CT imaging of the abdomen and pelvis was performed
using the standard protocol following bolus administration of
intravenous contrast. Sagittal and coronal MPR images reconstructed
from axial data set.
CONTRAST:  100mL OMNIPAQUE IOHEXOL 300 MG/ML SOLN IV. Dilute oral
contrast.

[Series 2: abd_pel_with 5.0 b40f · axial · 0.77mm/px · z∈[-576,-176]mm · 13 of 90 slices shown, 15 images]
[im 5/90  soft-tissue]
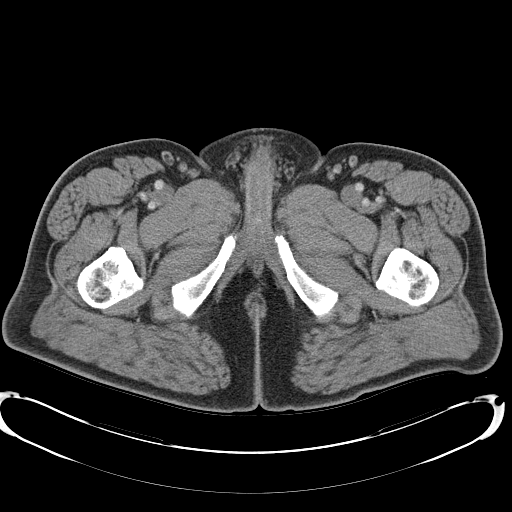
[im 5/90  bone]
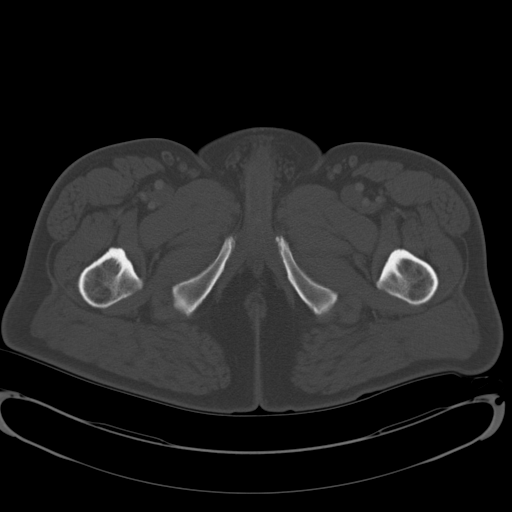
[im 14/90  soft-tissue]
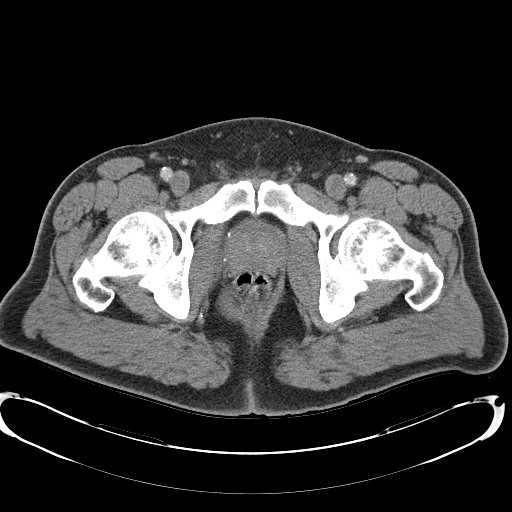
[im 18/90  soft-tissue]
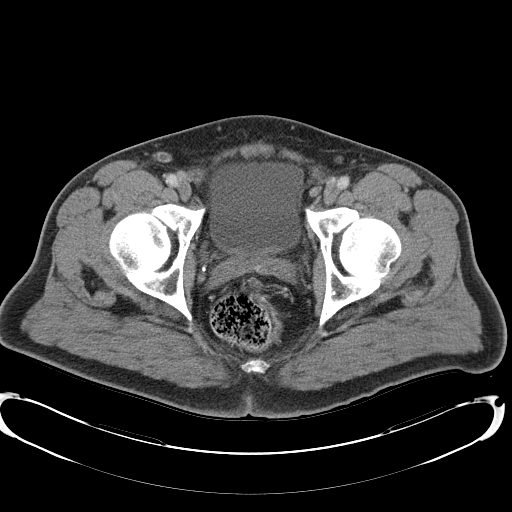
[im 27/90  soft-tissue]
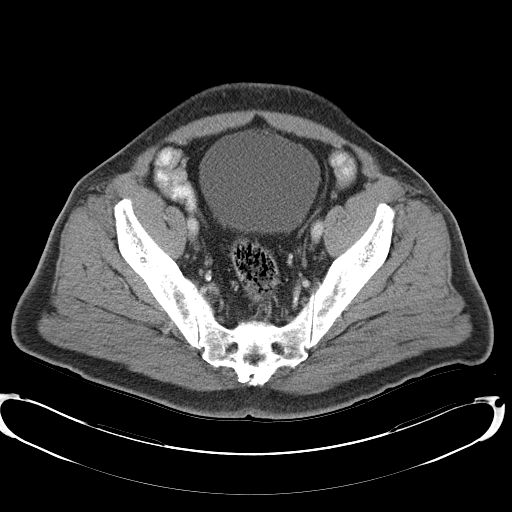
[im 32/90  soft-tissue]
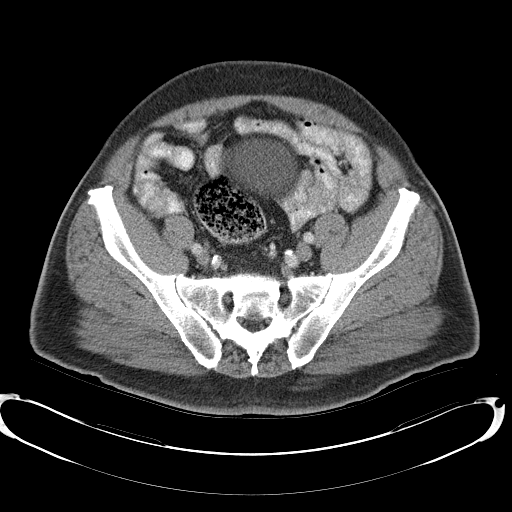
[im 41/90  soft-tissue]
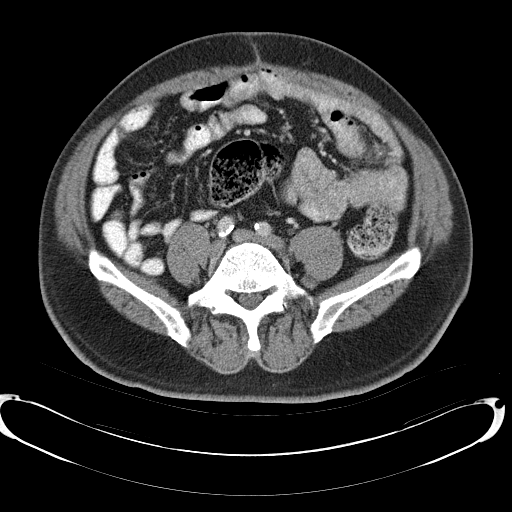
[im 45/90  soft-tissue]
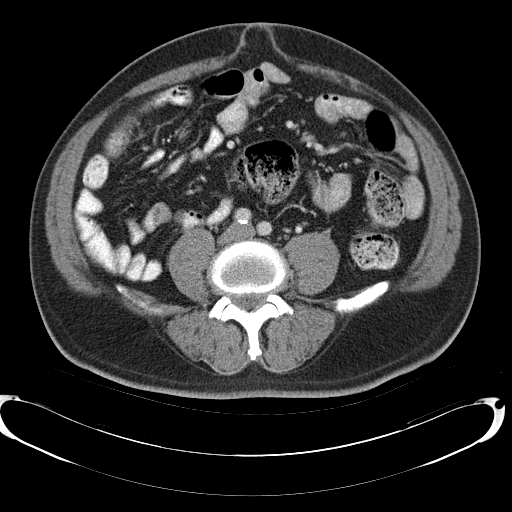
[im 49/90  soft-tissue]
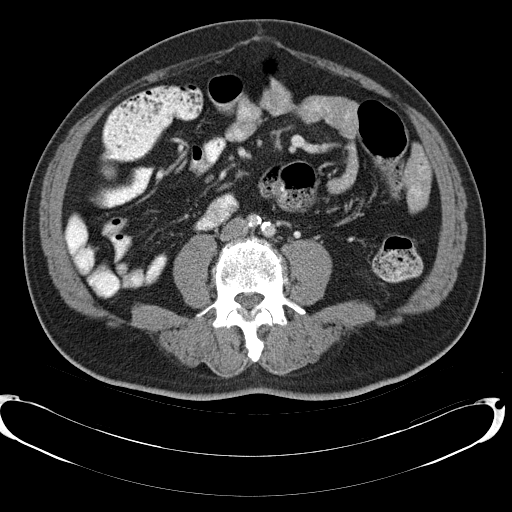
[im 58/90  soft-tissue]
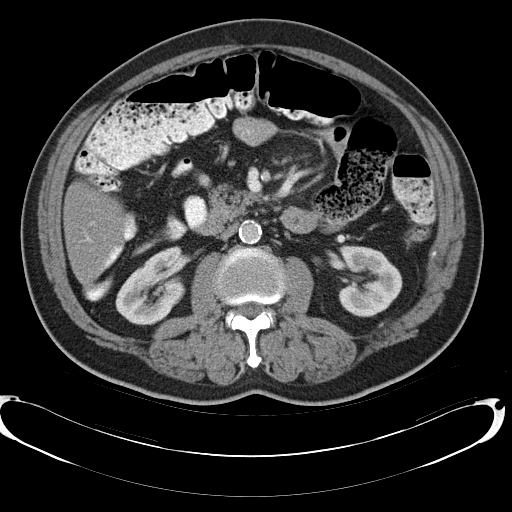
[im 58/90  bone]
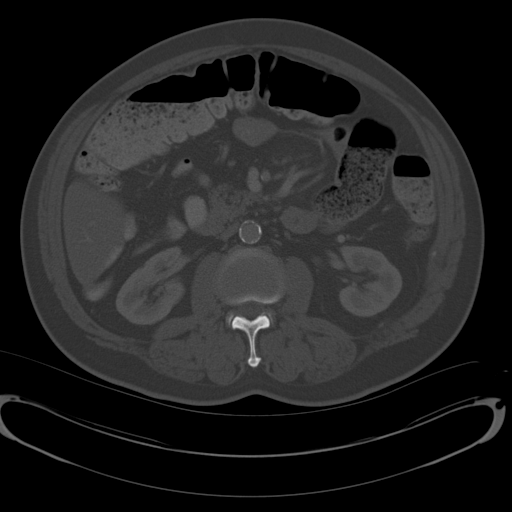
[im 63/90  soft-tissue]
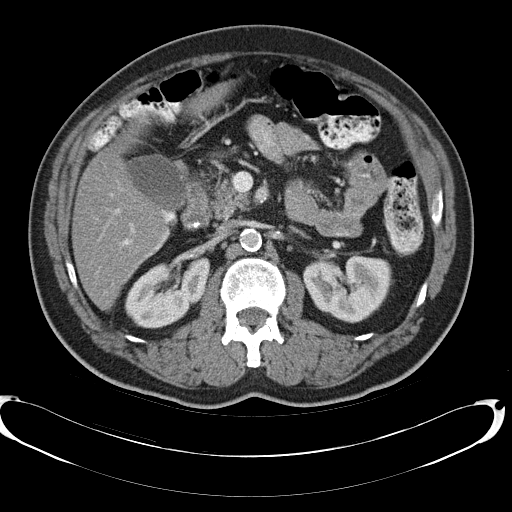
[im 72/90  soft-tissue]
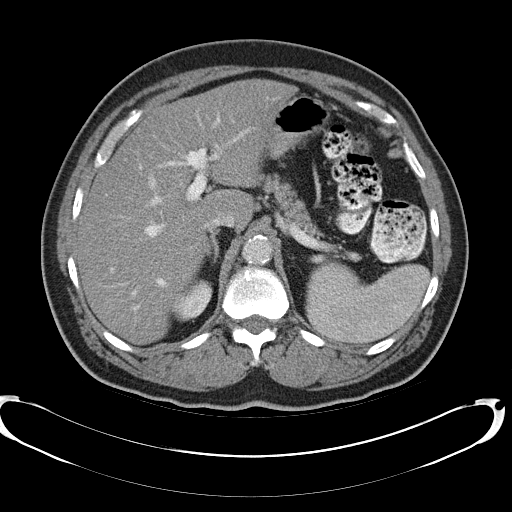
[im 76/90  soft-tissue]
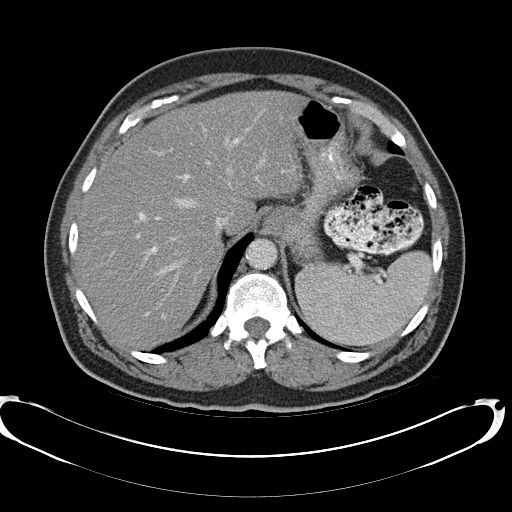
[im 85/90  soft-tissue]
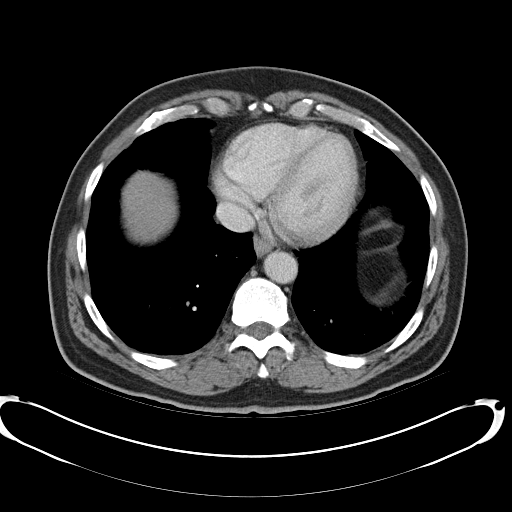

[Series 3: abd_pel_with 3.0 spo cor · coronal · 0.75mm/px · 3 of 99 slices shown]
[im 33/99  soft-tissue]
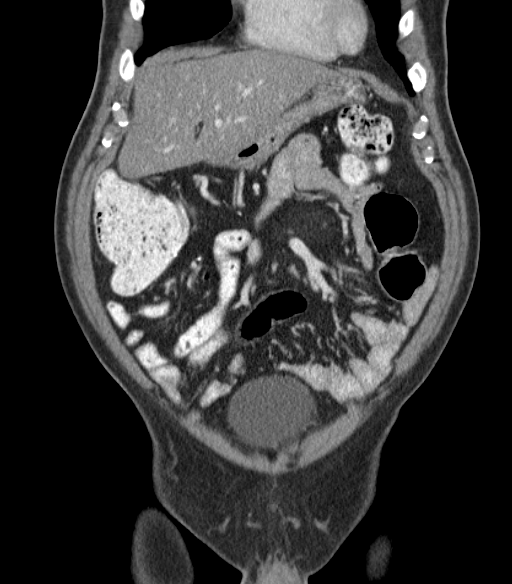
[im 44/99  soft-tissue]
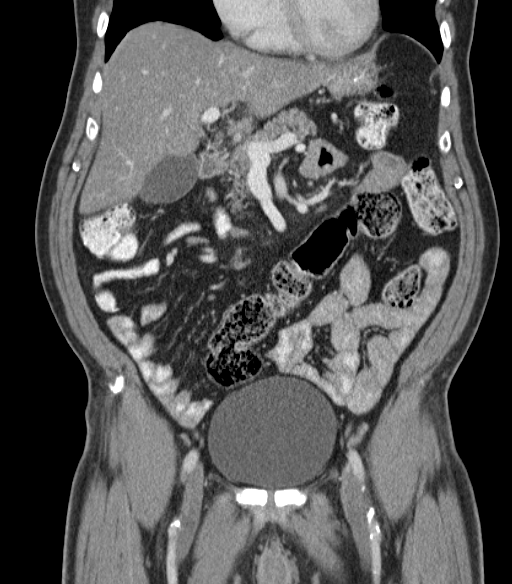
[im 55/99  soft-tissue]
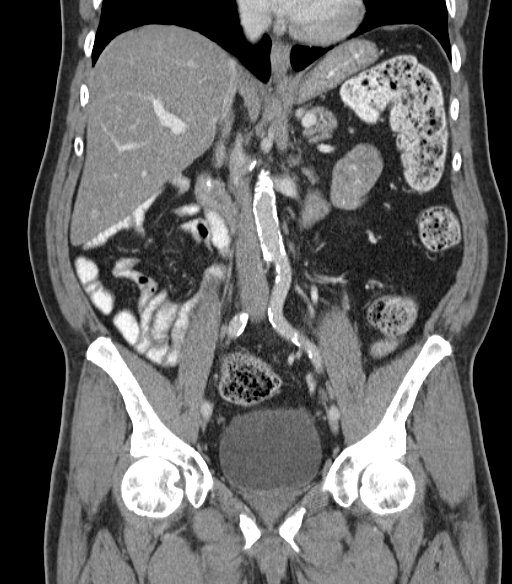

[16 of 46 positions shown; findings below may reference images not displayed]

FINDINGS: Question 11 mm diameter nodule at posteromedial RIGHT lung base
image 1 versus volume averaging with the inferior aspect of a
pulmonary artery, incompletely visualized.

Diffuse fatty infiltration of liver.

14 x 14 mm exophytic LEFT renal cyst image 31 unchanged.

Liver, gallbladder, spleen, pancreas, kidneys, and adrenal glands
normal appearance.

Normal appearing bladder and ureters.

Prostatic enlargement, gland 4.8 x 3.7 cm image 77.

Mildly prominent stool in colon with postsurgical changes of RIGHT
hemicolectomy.

No obvious colonic mass or wall thickening.

Stomach and small bowel loops normal appearance.

Small umbilical hernia containing fat.

Single mildly enlarged RIGHT mesenteric lymph node 12 mm short axis
image 38, new.

8 mm short axis RIGHT upper quadrant node adjacent to crux of
diaphragm image 22, stable.

No mass, additional adenopathy, free air, free fluid or inflammatory
process.

No acute osseous findings.
IMPRESSION: New mildly enlarged RIGHT mesenteric lymph node 12 mm short axis.

Postsurgical changes of RIGHT hemicolectomy.

Fatty infiltration of liver.

Small umbilical hernia containing fat.

Prostatic enlargement.

No other intra-abdominal or intrapelvic abnormalities.

Questionable volume-averaging with an inferior RIGHT pulmonary vein
on the first image though cannot exclude RIGHT lower lobe pulmonary
nodule ; consider CT chest imaging to exclude pulmonary nodule.

## 2015-12-14 ENCOUNTER — Other Ambulatory Visit (HOSPITAL_COMMUNITY): Payer: Self-pay | Admitting: *Deleted

## 2015-12-14 DIAGNOSIS — E538 Deficiency of other specified B group vitamins: Secondary | ICD-10-CM

## 2016-03-14 ENCOUNTER — Other Ambulatory Visit (HOSPITAL_COMMUNITY): Payer: Medicare Other

## 2016-03-18 ENCOUNTER — Encounter (HOSPITAL_COMMUNITY): Payer: Medicare Other | Attending: Hematology & Oncology

## 2016-03-18 DIAGNOSIS — E538 Deficiency of other specified B group vitamins: Secondary | ICD-10-CM | POA: Diagnosis present

## 2016-03-18 LAB — VITAMIN B12: Vitamin B-12: 683 pg/mL (ref 180–914)

## 2016-05-19 ENCOUNTER — Ambulatory Visit (HOSPITAL_COMMUNITY): Payer: Medicare Other | Admitting: Hematology & Oncology

## 2016-05-31 NOTE — Progress Notes (Signed)
This encounter was created in error - please disregard.

## 2018-04-28 ENCOUNTER — Encounter: Payer: Self-pay | Admitting: Gastroenterology

## 2019-02-28 ENCOUNTER — Telehealth: Payer: Self-pay | Admitting: Gastroenterology

## 2019-02-28 NOTE — Progress Notes (Signed)
REVIEWED-NO ADDITIONAL RECOMMENDATIONS. 

## 2019-02-28 NOTE — Telephone Encounter (Signed)
Routing to triage nurse.

## 2019-02-28 NOTE — Telephone Encounter (Signed)
RECALL/TRIAGE PT FOR TCS AFTER JUN 1-Dx: COLON CANCER, SURVEILLANCE IS OVERDUE.

## 2019-02-28 NOTE — Telephone Encounter (Signed)
Looks like he may just need a nurse visit.  Almyra Free, what are your thoughts on this?

## 2019-03-02 NOTE — Telephone Encounter (Signed)
This patient has a history of Colon Cancer and will need an OV. Please schedule OV.

## 2019-03-14 ENCOUNTER — Encounter: Payer: Self-pay | Admitting: Gastroenterology

## 2019-03-14 NOTE — Telephone Encounter (Signed)
SCHEDULED PATIENT AND MAILED LETTER  °

## 2019-05-25 ENCOUNTER — Ambulatory Visit (INDEPENDENT_AMBULATORY_CARE_PROVIDER_SITE_OTHER): Payer: Medicare Other | Admitting: Nurse Practitioner

## 2019-05-25 ENCOUNTER — Other Ambulatory Visit: Payer: Self-pay

## 2019-05-25 ENCOUNTER — Encounter: Payer: Self-pay | Admitting: Nurse Practitioner

## 2019-05-25 VITALS — BP 142/74 | HR 84 | Temp 97.7°F | Ht 71.0 in | Wt 192.4 lb

## 2019-05-25 DIAGNOSIS — Z85038 Personal history of other malignant neoplasm of large intestine: Secondary | ICD-10-CM

## 2019-05-25 MED ORDER — PEG 3350-KCL-NA BICARB-NACL 420 G PO SOLR: 4000.0000 mL | ORAL | 0 refills | Status: DC

## 2019-05-25 NOTE — Progress Notes (Signed)
Referring Provider: No ref. provider found Primary Care Physician:  Jaynee Eagles, MD (Inactive) Primary GI:  Dr. Oneida Alar  Chief Complaint  Patient presents with  . Consult    TCS last done 2016    HPI:   Jackson Bowen is a 68 y.o. male who presents to schedule a colonoscopy.  Nurse/phone triage was deferred to office visit due to patient personal history of colon cancer.  Patient was last seen in our office 04/18/2015 for history of colon cancer, steatosis of liver.  History of stage IIa CRC diagnosed in 2014 in the descending colon status post right hemicolectomy on 02/07/2013 with no further postoperative therapy recommended.  CT imaging by oncology demonstrated moderate to severe steatosis although labs showed normal liver function on CMP, baseline hemoglobin of 12.1.  Also noted no metastatic disease found.  At his last visit he noted he was doing great, no problems, remains active.  His blood sugar is out of control, per his assessment.  Has a follow-up appointment with primary care to get a handle on this.  Had recently cut out sodas but was drinking more at his last visit (sodas).  Bowel movement about 3-4 times a day consistently [place] 5-6 in the setting of status post partial hemicolectomy.  No other GI complaints.  Recommended colonoscopy and follow-up based on post procedure recommendations.  Colonoscopy was updated 04/30/2015 which found normal colonic mucosa, moderate size external and internal hemorrhoids, normal terminal ileum.  Recommended high-fiber diet, Preparation H as needed, next colonoscopy in 3 years (2019).  Also recommended all sisters, brothers, children, and parents need a colonoscopy starting at the age of 33.  Today he states he's doing ok overall. Denies abdominal pain, N/V, hematochezia, melena, fever, chills, unintentional weight loss. Denies URI or flu-like symptoms. Denies loss of sense of taste or smell. Denies chest pain, dyspnea, dizziness,  lightheadedness, syncope, near syncope. Denies any other upper or lower GI symptoms.  Will need to stop iron 1 week prior to TCS.  Past Medical History:  Diagnosis Date  . Colon cancer ([place])   . Colon cancer ([place]) 02/24/2014  . Diabetes mellitus without complication ([place])   . High blood cholesterol   . Hypertension     Past Surgical History:  Procedure Laterality Date  . BACK SURGERY     for ruptured disc  . CARPAL TUNNEL RELEASE     left hand  . COLONOSCOPY N/A 04/30/2015   Procedure: COLONOSCOPY;  Surgeon: Danie Binder, MD;  Location: [facility];  Service: Endoscopy;  Laterality: N/A;  1245  . removal of cyst     right face  . Segmental Colectomy     02/07/2013    Current Outpatient Medications  Medication Sig Dispense Refill  . B Complex Vitamins (B COMPLEX-B12 PO) Take by mouth daily.    . Cholecalciferol (D3 VITAMIN PO) Take by mouth daily.    . Fish Oil OIL Take 1 capsule by mouth daily.     . IRON PO Take 65 mg by mouth 2 (two) times daily.     Marland Kitchen LANTUS SOLOSTAR 100 UNIT/ML SOPN Inject 4.5 Units into the skin at bedtime.     Marland Kitchen losartan-hydrochlorothiazide (HYZAAR) 100-12.5 MG per tablet Take 1 tablet by mouth daily.    Marland Kitchen lovastatin (MEVACOR) 40 MG tablet Take 1 tablet by mouth at bedtime.    . metFORMIN (GLUCOPHAGE) 1000 MG tablet Take 1,000 mg by mouth 2 (two) times daily.    Marland Kitchen  UNABLE TO FIND Take 1 capsule by mouth daily. Med Name: Muscadine Plus Dietary Supplement    . vitamin C (ASCORBIC ACID) 500 MG tablet Take 500 mg by mouth daily.     No current facility-administered medications for this visit.     Allergies as of 05/25/2019  . (No Known Allergies)    Family History  Problem Relation Age of Onset  . Diabetes Mother   . Stroke Father   . Cancer Sister   . Colon cancer Neg Hx     Social History   Socioeconomic History  . Marital status: Married    Spouse name: Not on file  . Number of children: Not on file  . Years of education: Not on file   . Highest education level: Not on file  Occupational History  . Not on file  Social Needs  . Financial resource strain: Not on file  . Food insecurity    Worry: Not on file    Inability: Not on file  . Transportation needs    Medical: Not on file    Non-medical: Not on file  Tobacco Use  . Smoking status: Former Smoker    Packs/day: 1.00    Years: 15.00    Pack years: 15.00    Types: Cigarettes    Quit date: 04/17/1990    Years since quitting: 29.1  . Smokeless tobacco: Never Used  Substance and Sexual Activity  . Alcohol use: No    Alcohol/week: 0.0 standard drinks  . Drug use: No  . Sexual activity: Not on file  Lifestyle  . Physical activity    Days per week: Not on file    Minutes per session: Not on file  . Stress: Not on file  Relationships  . Social Herbalist on phone: Not on file    Gets together: Not on file    Attends religious service: Not on file    Active member of club or organization: Not on file    Attends meetings of clubs or organizations: Not on file    Relationship status: Not on file  Other Topics Concern  . Not on file  Social History Narrative  . Not on file    Review of Systems: Complete ROS negative except as per HPI.   Physical Exam: BP (!) 142/74   Pulse 84   Temp 97.7 F (36.5 C) (Oral)   Ht 5\' 11"  (1.803 m)   Wt 192 lb 6.4 oz (87.3 kg)   BMI 26.83 kg/m  General:   Alert and oriented. Pleasant and cooperative. Well-nourished and well-developed.  Head:  Normocephalic and atraumatic. Eyes:  Without icterus, sclera clear and conjunctiva pink.  Ears:  Normal auditory acuity. Cardiovascular:  S1, S2 present without murmurs appreciated. Extremities without clubbing or edema. Respiratory:  Clear to auscultation bilaterally. No wheezes, rales, or rhonchi. No distress.  Gastrointestinal:  +BS, soft, non-tender and non-distended. No HSM noted. No guarding or rebound. No masses appreciated.  Rectal:  Deferred   Musculoskalatal:  Symmetrical without gross deformities. Skin:  Intact without significant lesions or rashes. Neurologic:  Alert and oriented x4;  grossly normal neurologically. Psych:  Alert and cooperative. Normal mood and affect. Heme/Lymph/Immune: No excessive bruising noted.    05/25/2019 8:44 AM   Disclaimer: This note was dictated with voice recognition software. Similar sounding words can inadvertently be transcribed and may not be corrected upon review.

## 2019-05-25 NOTE — Patient Instructions (Signed)
Your health issues we discussed today were:   History of colon cancer: 1. I am glad you are doing well! 2. We will proceed with your colonoscopy as previously recommended 3. We will make adjustments to your diabetes medicines and your iron prior to your procedure 4. Further recommendations will follow your procedure  Overall I recommend:  1. Continue your current medications 2. Return for follow-up based on recommendations made after your procedure 3. Call us if you have any questions or concerns.   Because of recent events of COVID-19 ("Coronavirus"), follow CDC recommendations:  1. Wash your hand frequently 2. Avoid touching your face 3. Stay away from people who are sick 4. If you have symptoms such as fever, cough, shortness of breath then call your healthcare provider for further guidance 5. If you are sick, STAY AT HOME unless otherwise directed by your healthcare provider. 6. Follow directions from state and national officials regarding staying safe   At [facility] we value your feedback. You may receive a survey about your visit today. Please share your experience as we strive to create trusting relationships with our patients to provide genuine, compassionate, quality care.  We appreciate your understanding and patience as we review any laboratory studies, imaging, and other diagnostic tests that are ordered as we care for you. Our office policy is 5 business days for review of these results, and any emergent or urgent results are addressed in a timely manner for your best interest. If you do not hear from our office in 1 week, please contact us.   We also encourage the use of MyChart, which contains your medical information for your review as well. If you are not enrolled in this feature, an access code is on this after visit summary for your convenience. Thank you for allowing [place] to be involved in your care.  It was great to see you today!  I hope you have a  great summer!!

## 2019-05-25 NOTE — Assessment & Plan Note (Signed)
The patient is currently 1 year overdue for surveillance colonoscopy based on personal history of colon cancer.  He is status post partial hemicolectomy for cancer and 2014 and an ascending colon.  No postoperative chemotherapy or radiation required.  His last colonoscopy was in 2016 which found normal mucosa and recommended repeat colonoscopy in 3 years.  He is currently asymptomatic from a GI standpoint.  We will proceed with updated colonoscopy at this time.  Proceed with colonoscopy with Dr. Oneida Alar in the near future. The risks, benefits, and alternatives have been discussed in detail with the patient. They state understanding and desire to proceed.   The patient is not on any anticoagulants, anxiolytics, chronic pain medications, or antidepressants.  He is on insulin and metformin with instructions provided for alterations.  He is also on iron which would need to be held for 7 days prior to colonoscopy.  Conscious sedation should be adequate for his procedure as it was for his last.

## 2019-05-26 NOTE — Progress Notes (Signed)
Faxed to pcp °

## 2019-06-27 ENCOUNTER — Other Ambulatory Visit: Payer: Self-pay

## 2019-06-27 ENCOUNTER — Other Ambulatory Visit: Payer: Self-pay | Admitting: *Deleted

## 2019-06-27 ENCOUNTER — Other Ambulatory Visit (HOSPITAL_COMMUNITY)
Admission: RE | Admit: 2019-06-27 | Discharge: 2019-06-27 | Disposition: A | Discharge status: Home / Self Care | Payer: Medicare Other | Source: Ambulatory Visit | Attending: Gastroenterology | Admitting: Gastroenterology

## 2019-06-27 DIAGNOSIS — Z01812 Encounter for preprocedural laboratory examination: Secondary | ICD-10-CM | POA: Diagnosis not present

## 2019-06-27 DIAGNOSIS — Z87891 Personal history of nicotine dependence: Secondary | ICD-10-CM | POA: Diagnosis not present

## 2019-06-27 DIAGNOSIS — K6389 Other specified diseases of intestine: Secondary | ICD-10-CM | POA: Diagnosis not present

## 2019-06-27 DIAGNOSIS — K649 Unspecified hemorrhoids: Secondary | ICD-10-CM | POA: Diagnosis not present

## 2019-06-27 DIAGNOSIS — Z20828 Contact with and (suspected) exposure to other viral communicable diseases: Secondary | ICD-10-CM | POA: Diagnosis not present

## 2019-06-27 DIAGNOSIS — Z85038 Personal history of other malignant neoplasm of large intestine: Secondary | ICD-10-CM | POA: Diagnosis not present

## 2019-06-27 LAB — SARS CORONAVIRUS 2 (TAT 6-24 HRS): SARS Coronavirus 2: NEGATIVE

## 2019-06-29 ENCOUNTER — Ambulatory Visit (HOSPITAL_COMMUNITY)
Admission: RE | Admit: 2019-06-29 | Discharge: 2019-06-29 | Disposition: A | Discharge status: Home / Self Care | Payer: Medicare Other | Attending: Gastroenterology | Admitting: Gastroenterology

## 2019-06-29 ENCOUNTER — Encounter (HOSPITAL_COMMUNITY)
Admission: RE | Disposition: A | Discharge status: Home / Self Care | Payer: Self-pay | Source: Home / Self Care | Attending: Gastroenterology

## 2019-06-29 ENCOUNTER — Encounter (HOSPITAL_COMMUNITY): Payer: Self-pay | Admitting: *Deleted

## 2019-06-29 ENCOUNTER — Other Ambulatory Visit: Payer: Self-pay

## 2019-06-29 DIAGNOSIS — E119 Type 2 diabetes mellitus without complications: Secondary | ICD-10-CM | POA: Insufficient documentation

## 2019-06-29 DIAGNOSIS — Z980 Intestinal bypass and anastomosis status: Secondary | ICD-10-CM | POA: Diagnosis not present

## 2019-06-29 DIAGNOSIS — Z794 Long term (current) use of insulin: Secondary | ICD-10-CM | POA: Diagnosis not present

## 2019-06-29 DIAGNOSIS — K644 Residual hemorrhoidal skin tags: Secondary | ICD-10-CM | POA: Diagnosis not present

## 2019-06-29 DIAGNOSIS — Z7982 Long term (current) use of aspirin: Secondary | ICD-10-CM | POA: Diagnosis not present

## 2019-06-29 DIAGNOSIS — I10 Essential (primary) hypertension: Secondary | ICD-10-CM | POA: Diagnosis not present

## 2019-06-29 DIAGNOSIS — Z09 Encounter for follow-up examination after completed treatment for conditions other than malignant neoplasm: Secondary | ICD-10-CM | POA: Insufficient documentation

## 2019-06-29 DIAGNOSIS — K648 Other hemorrhoids: Secondary | ICD-10-CM | POA: Diagnosis not present

## 2019-06-29 DIAGNOSIS — E7800 Pure hypercholesterolemia, unspecified: Secondary | ICD-10-CM | POA: Insufficient documentation

## 2019-06-29 DIAGNOSIS — Z79899 Other long term (current) drug therapy: Secondary | ICD-10-CM | POA: Diagnosis not present

## 2019-06-29 DIAGNOSIS — Z87891 Personal history of nicotine dependence: Secondary | ICD-10-CM | POA: Insufficient documentation

## 2019-06-29 DIAGNOSIS — Q439 Congenital malformation of intestine, unspecified: Secondary | ICD-10-CM | POA: Diagnosis not present

## 2019-06-29 DIAGNOSIS — Z85038 Personal history of other malignant neoplasm of large intestine: Secondary | ICD-10-CM | POA: Insufficient documentation

## 2019-06-29 DIAGNOSIS — Z9049 Acquired absence of other specified parts of digestive tract: Secondary | ICD-10-CM | POA: Insufficient documentation

## 2019-06-29 HISTORY — PX: COLONOSCOPY: SHX5424

## 2019-06-29 LAB — GLUCOSE, CAPILLARY: Glucose-Capillary: 115 mg/dL — ABNORMAL HIGH (ref 70–99)

## 2019-06-29 SURGERY — COLONOSCOPY
Anesthesia: Moderate Sedation

## 2019-06-29 MED ORDER — MIDAZOLAM HCL 5 MG/5ML IJ SOLN
INTRAMUSCULAR | Status: AC
  Filled 2019-06-29: qty 10

## 2019-06-29 MED ORDER — MEPERIDINE HCL 100 MG/ML IJ SOLN
INTRAMUSCULAR | Status: AC
  Filled 2019-06-29: qty 2

## 2019-06-29 MED ORDER — MIDAZOLAM HCL 5 MG/5ML IJ SOLN
INTRAMUSCULAR | Status: DC | PRN
  Administered 2019-06-29: 2 mg via INTRAVENOUS

## 2019-06-29 MED ORDER — MEPERIDINE HCL 100 MG/ML IJ SOLN
INTRAMUSCULAR | Status: DC | PRN
  Administered 2019-06-29 (×2): 25 mg via INTRAVENOUS

## 2019-06-29 MED ORDER — SODIUM CHLORIDE 0.9 % IV SOLN
INTRAVENOUS | Status: DC
  Administered 2019-06-29: 1000 mL via INTRAVENOUS

## 2019-06-29 MED ORDER — STERILE WATER FOR IRRIGATION IR SOLN
Status: DC | PRN
  Administered 2019-06-29: 2.5 mL

## 2019-06-29 NOTE — H&P (Signed)
Primary Care Physician:  Jaynee Eagles, MD Primary Gastroenterologist:  Dr. Oneida Alar  Pre-Procedure History & Physical: HPI:  Jackson Bowen is a 68 y.o. male here for PERSONAL HISTORY OF COLON CANCER.  Past Medical History:  Diagnosis Date  . Colon cancer ([place])   . Colon cancer ([place]) 02/24/2014  . Diabetes mellitus without complication ([place])   . High blood cholesterol   . Hypertension     Past Surgical History:  Procedure Laterality Date  . BACK SURGERY     for ruptured disc  . CARPAL TUNNEL RELEASE     left hand  . COLONOSCOPY N/A 04/30/2015   Procedure: COLONOSCOPY;  Surgeon: Danie Binder, MD;  Location: [facility];  Service: Endoscopy;  Laterality: N/A;  1245  . removal of cyst     right face  . Segmental Colectomy     02/07/2013    Prior to Admission medications   Medication Sig Start Date End Date Taking? Authorizing Provider  aspirin EC 81 MG tablet Take 81 mg by mouth daily.   Yes [provider]  Cholecalciferol (VITAMIN D3) 50 MCG (2000 UT) TABS Take 2,000 Units by mouth daily.   Yes [provider]  empagliflozin (JARDIANCE) 10 MG TABS tablet Take 10 mg by mouth every other day. In the morning   Yes [provider]  LANTUS SOLOSTAR 100 UNIT/ML SOPN Inject 10 Units into the skin at bedtime.  01/12/13  Yes [provider]  losartan-hydrochlorothiazide (HYZAAR) 100-12.5 MG per tablet Take 1 tablet by mouth daily. 02/07/13  Yes [provider]  lovastatin (MEVACOR) 40 MG tablet Take 40 mg by mouth every evening.  12/01/12  Yes [provider]  metFORMIN (GLUCOPHAGE) 1000 MG tablet Take 1,000 mg by mouth 2 (two) times daily. 12/01/12  Yes [provider]  Omega-3 Fatty Acids (FISH OIL) 500 MG CAPS Take 500 mg by mouth every other day. At night.   Yes [provider]  polyethylene glycol-electrolytes (TRILYTE) 420 g solution Take 4,000 mLs by mouth as directed. 05/25/19  Yes Jakeira Seeman L, MD   UNABLE TO FIND Take 1 capsule by mouth daily. [place] Plus Dietary Supplement   Yes [provider]  vitamin B-12 (CYANOCOBALAMIN) 500 MCG tablet Take 500 mcg by mouth daily.   Yes [provider]  vitamin C (ASCORBIC ACID) 500 MG tablet Take 500 mg by mouth daily.   Yes [provider]    Allergies as of 05/25/2019  . (No Known Allergies)    Family History  Problem Relation Age of Onset  . Diabetes Mother   . Stroke Father   . Cancer Sister   . Colon cancer Neg Hx     Social History   Socioeconomic History  . Marital status: Married    Spouse name: Not on file  . Number of children: Not on file  . Years of education: Not on file  . Highest education level: Not on file  Occupational History  . Not on file  Social Needs  . Financial resource strain: Not on file  . Food insecurity    Worry: Not on file    Inability: Not on file  . Transportation needs    Medical: Not on file    Non-medical: Not on file  Tobacco Use  . Smoking status: Former Smoker    Packs/day: 1.00    Years: 15.00    Pack years: 15.00    Types: Cigarettes    Quit  date: 04/17/1990    Years since quitting: 29.2  . Smokeless tobacco: Never Used  Substance and Sexual Activity  . Alcohol use: No    Alcohol/week: 0.0 standard drinks  . Drug use: No  . Sexual activity: Not on file  Lifestyle  . Physical activity    Days per week: Not on file    Minutes per session: Not on file  . Stress: Not on file  Relationships  . Social Herbalist on phone: Not on file    Gets together: Not on file    Attends religious service: Not on file    Active member of club or organization: Not on file    Attends meetings of clubs or organizations: Not on file    Relationship status: Not on file  . Intimate partner violence    Fear of current or ex partner: Not on file    Emotionally abused: Not on file    Physically abused: Not on file    Forced sexual activity: Not on file   Other Topics Concern  . Not on file  Social History Narrative  . Not on file    Review of Systems: See HPI, otherwise negative ROS   Physical Exam: BP (!) 111/54   Pulse (!) 55   Temp 98.6 F (37 C) (Oral)   Resp 14   Ht 5' 11.5" (1.816 m)   Wt 87.1 kg   SpO2 100%   BMI 26.41 kg/m  General:   Alert,  pleasant and cooperative in NAD Head:  Normocephalic and atraumatic. Neck:  Supple; Lungs:  Clear throughout to auscultation.    Heart:  Regular rate and rhythm. Abdomen:  Soft, nontender and nondistended. Normal bowel sounds, without guarding, and without rebound.   Neurologic:  Alert and  oriented x4;  grossly normal neurologically.  Impression/Plan:      PERSONAL HISTORY OF COLON CANCER  PLAN: 1. TCS TODAY. DISCUSSED PROCEDURE, BENEFITS, & RISKS: < 1% chance of medication reaction, bleeding, perforation, ASPIRATION, or rupture of spleen/liver requiring surgery to fix it and missed polyps < 1 cm 10-20% of the time.

## 2019-06-29 NOTE — Op Note (Signed)
[facility] Patient Name: Jackson Bowen Procedure Date: 06/29/2019 7:57 AM MRN: [id number] Date of Birth: January 13, 1951 Attending MD: Barney Drain MD, MD CSN: [id number] Age: 68 Admit Type: Outpatient Procedure:                Colonoscopy, SURVEILLANCE Indications:              Personal history of malignant neoplasm of the RIGHT                            colon APR 2014 Providers:                Barney Drain MD, MD, Lurline Del, RN, Randa Spike, Technician Referring MD:             Cathren Laine, MD Medicines:                Meperidine 50 mg IV, Midazolam 4 mg IV Complications:            No immediate complications. Estimated Blood Loss:     Estimated blood loss: none. Procedure:                Pre-Anesthesia Assessment:                           - Prior to the procedure, a History and Physical                            was performed, and patient medications and                            allergies were reviewed. The patient's tolerance of                            previous anesthesia was also reviewed. The risks                            and benefits of the procedure and the sedation                            options and risks were discussed with the patient.                            All questions were answered, and informed consent                            was obtained. Prior Anticoagulants: The patient has                            taken no previous anticoagulant or antiplatelet                            agents except for aspirin. ASA Grade Assessment: II                            -  A patient with mild systemic disease. After                            reviewing the risks and benefits, the patient was                            deemed in satisfactory condition to undergo the                            procedure. After obtaining informed consent, the                            colonoscope was passed under direct vision.                             Throughout the procedure, the patient's blood                            pressure, pulse, and oxygen saturations were                            monitored continuously. The [id number]                            scope was introduced through the anus and advanced                            to the the cecum, identified by appendiceal orifice                            and ileocecal valve. The colonoscopy was somewhat                            difficult due to post-surgical anatomy. Successful                            completion of the procedure was aided by                            straightening and shortening the scope to obtain                            bowel loop reduction and COLOWRAP. The patient                            tolerated the procedure well. The quality of the                            bowel preparation was excellent. The rectum and                            ANASTOMOSIS AND RECTUM were photographed. Scope In: 8:55:18 AM Scope Out: 9:04:52 AM Scope Withdrawal Time: 0 hours 7 minutes 54 seconds  Total  Procedure Duration: 0 hours 9 minutes 34 seconds  Findings:      The anastomosis appeared normal.      The exam was otherwise without abnormality.      External and internal hemorrhoids were found. The hemorrhoids were       moderate.      The recto-sigmoid colon and sigmoid colon were mildly tortuous. Impression:               - The colonic anastomosis is normal.                           - The examination was otherwise normal.                           - External and internal hemorrhoids.                           - MILDLY Tortuous LEFT colon. Moderate Sedation:      Moderate (conscious) sedation was administered by the endoscopy nurse       and supervised by the endoscopist. The following parameters were       monitored: oxygen saturation, heart rate, blood pressure, and response       to care. Total physician intraservice time was 20  minutes. Recommendation:           - Patient has a contact number available for                            emergencies. The signs and symptoms of potential                            delayed complications were discussed with the                            patient. Return to normal activities tomorrow.                            Written discharge instructions were provided to the                            patient.                           - High fiber diet.                           - Continue present medications.                           - Repeat colonoscopy in 5 years for surveillance. Procedure Code(s):        --- Professional ---                           [phone], Colonoscopy, flexible; diagnostic, including                            collection of specimen(s) by brushing or washing,  when performed (separate procedure)                           G0500, Moderate sedation services provided by the                            same physician or other qualified health care                            professional performing a gastrointestinal                            endoscopic service that sedation supports,                            requiring the presence of an independent trained                            observer to assist in the monitoring of the                            patient's level of consciousness and physiological                            status; initial 15 minutes of intra-service time;                            patient age 57 years or older (additional time may                            be reported with [phone], as appropriate) Diagnosis Code(s):        --- Professional ---                           K64.8, Other hemorrhoids                           Z85.038, Personal history of other malignant                            neoplasm of large intestine                           Q43.8, Other specified congenital malformations of                             intestine CPT copyright 2019 [employer]. All rights reserved. The codes documented in this report are preliminary and upon coder review may  be revised to meet current compliance requirements. Barney Drain, MD Barney Drain MD, MD 06/29/2019 9:15:33 AM This report has been signed electronically. Number of Addenda: 0

## 2019-06-29 NOTE — Discharge Instructions (Signed)
You have moderate size internal AND EXTERNAL hemorrhoids. YOU DID NOT HAVE ANY POLYPS.   DRINK WATER TO KEEP YOUR URINE LIGHT YELLOW.  FOLLOW A HIGH FIBER DIET. AVOID ITEMS THAT CAUSE BLOATING. SEE INFO BELOW.   USE PREPARATION H FOUR TIMES  A DAY IF NEEDED TO RELIEVE RECTAL PAIN/PRESSURE/BLEEDING.   Next colonoscopy in 5 years.  Colonoscopy Care After Read the instructions outlined below and refer to this sheet in the next week. These discharge instructions provide you with general information on caring for yourself after you leave the hospital. While your treatment has been planned according to the most current medical practices available, unavoidable complications occasionally occur. If you have any problems or questions after discharge, call DR. Caylynn Minchew, [phone].  ACTIVITY  You may resume your regular activity, but move at a slower pace for the next 24 hours.   Take frequent rest periods for the next 24 hours.   Walking will help get rid of the air and reduce the bloated feeling in your belly (abdomen).   No driving for 24 hours (because of the medicine (anesthesia) used during the test).   You may shower.   Do not sign any important legal documents or operate any machinery for 24 hours (because of the anesthesia used during the test).    NUTRITION  Drink plenty of fluids.   You may resume your normal diet as instructed by your doctor.   Begin with a light meal and progress to your normal diet. Heavy or fried foods are harder to digest and may make you feel sick to your stomach (nauseated).   Avoid alcoholic beverages for 24 hours or as instructed.    MEDICATIONS  You may resume your normal medications.   WHAT YOU CAN EXPECT TODAY  Some feelings of bloating in the abdomen.   Passage of more gas than usual.   Spotting of blood in your stool or on the toilet paper  .  IF YOU HAD POLYPS REMOVED DURING THE COLONOSCOPY:  Eat a soft diet IF YOU HAVE  NAUSEA, BLOATING, ABDOMINAL PAIN, OR VOMITING.    FINDING OUT THE RESULTS OF YOUR TEST Not all test results are available during your visit. DR. Oneida Alar WILL CALL YOU WITHIN 14 DAYS OF YOUR PROCEDUE WITH YOUR RESULTS. Do not assume everything is normal if you have not heard from DR. Kamaury Cutbirth, CALL HER OFFICE AT [phone].  SEEK IMMEDIATE MEDICAL ATTENTION AND CALL THE OFFICE: [phone] IF:  You have more than a spotting of blood in your stool.   Your belly is swollen (abdominal distention).   You are nauseated or vomiting.   You have a temperature over 101F.   You have abdominal pain or discomfort that is severe or gets worse throughout the day.  High-Fiber Diet A high-fiber diet changes your normal diet to include more whole grains, legumes, fruits, and vegetables. Changes in the diet involve replacing refined carbohydrates with unrefined foods. The calorie level of the diet is essentially unchanged. The Dietary Reference Intake (recommended amount) for adult males is 38 grams per day. For adult females, it is 25 grams per day. Pregnant and lactating women should consume 28 grams of fiber per day. Fiber is the intact part of a plant that is not broken down during digestion. Functional fiber is fiber that has been isolated from the plant to provide a beneficial effect in the body.  PURPOSE  Increase stool bulk.   Ease and regulate bowel movements.   Lower cholesterol.  REDUCE RISK OF COLON CANCER  INDICATIONS THAT YOU NEED MORE FIBER  Constipation and hemorrhoids.   Uncomplicated diverticulosis (intestine condition) and irritable bowel syndrome.   Weight management.   As a protective measure against hardening of the arteries (atherosclerosis), diabetes, and cancer.   GUIDELINES FOR INCREASING FIBER IN THE DIET  Start adding fiber to the diet slowly. A gradual increase of about 5 more grams (2 servings of most fruits or vegetables) per day is best. Too rapid an increase  in fiber may result in constipation, flatulence, and bloating.   Drink enough water and fluids to keep your urine clear or pale yellow. Water, juice, or caffeine-free drinks are recommended. Not drinking enough fluid may cause constipation.   Eat a variety of high-fiber foods rather than one type of fiber.   Try to increase your intake of fiber through using high-fiber foods rather than fiber pills or supplements that contain small amounts of fiber.   The goal is to change the types of food eaten. Do not supplement your present diet with high-fiber foods, but replace foods in your present diet.    Hemorrhoids Hemorrhoids are dilated (enlarged) veins around the rectum. Sometimes clots will form in the veins. This makes them swollen and painful. These are called thrombosed hemorrhoids. Causes of hemorrhoids include:  Constipation.   Straining to have a bowel movement.   HEAVY LIFTING  HOME CARE INSTRUCTIONS  Eat a well balanced diet and drink 6 to 8 glasses of water every day to avoid constipation. You may also use a bulk laxative.   Avoid straining to have bowel movements.   Keep anal area dry and clean.   Do not use a donut shaped pillow or sit on the toilet for long periods. This increases blood pooling and pain.   Move your bowels when your body has the urge; this will require less straining and will decrease pain and pressure.

## 2019-07-01 ENCOUNTER — Encounter (HOSPITAL_COMMUNITY): Payer: Self-pay | Admitting: Gastroenterology

## 2019-12-03 ENCOUNTER — Other Ambulatory Visit: Payer: Self-pay

## 2019-12-03 ENCOUNTER — Encounter (HOSPITAL_COMMUNITY): Payer: Self-pay | Admitting: Emergency Medicine

## 2019-12-03 ENCOUNTER — Emergency Department (HOSPITAL_COMMUNITY)
Admission: EM | Admit: 2019-12-03 | Discharge: 2019-12-04 | Disposition: A | Discharge status: Home / Self Care | Payer: Medicare Other | Attending: Emergency Medicine | Admitting: Emergency Medicine

## 2019-12-03 ENCOUNTER — Emergency Department (HOSPITAL_COMMUNITY): Payer: Medicare Other

## 2019-12-03 DIAGNOSIS — I10 Essential (primary) hypertension: Secondary | ICD-10-CM | POA: Insufficient documentation

## 2019-12-03 DIAGNOSIS — R310 Gross hematuria: Secondary | ICD-10-CM | POA: Insufficient documentation

## 2019-12-03 DIAGNOSIS — Z7982 Long term (current) use of aspirin: Secondary | ICD-10-CM | POA: Diagnosis not present

## 2019-12-03 DIAGNOSIS — N1330 Unspecified hydronephrosis: Secondary | ICD-10-CM | POA: Insufficient documentation

## 2019-12-03 DIAGNOSIS — E119 Type 2 diabetes mellitus without complications: Secondary | ICD-10-CM | POA: Diagnosis not present

## 2019-12-03 DIAGNOSIS — Z87891 Personal history of nicotine dependence: Secondary | ICD-10-CM | POA: Diagnosis not present

## 2019-12-03 DIAGNOSIS — R1031 Right lower quadrant pain: Secondary | ICD-10-CM | POA: Diagnosis present

## 2019-12-03 DIAGNOSIS — Z79899 Other long term (current) drug therapy: Secondary | ICD-10-CM | POA: Diagnosis not present

## 2019-12-03 DIAGNOSIS — Z7984 Long term (current) use of oral hypoglycemic drugs: Secondary | ICD-10-CM | POA: Insufficient documentation

## 2019-12-03 LAB — COMPREHENSIVE METABOLIC PANEL
ALT: 11 U/L (ref 0–44)
AST: 17 U/L (ref 15–41)
Albumin: 3.8 g/dL (ref 3.5–5.0)
Alkaline Phosphatase: 49 U/L (ref 38–126)
Anion gap: 7 (ref 5–15)
BUN: 13 mg/dL (ref 8–23)
CO2: 28 mmol/L (ref 22–32)
Calcium: 8.9 mg/dL (ref 8.9–10.3)
Chloride: 104 mmol/L (ref 98–111)
Creatinine, Ser: 1.32 mg/dL — ABNORMAL HIGH (ref 0.61–1.24)
GFR calc Af Amer: >60 mL/min (ref >60)
GFR calc non Af Amer: 54 mL/min — ABNORMAL LOW (ref >60)
Glucose, Bld: 112 mg/dL — ABNORMAL HIGH (ref 70–99)
Potassium: 3.6 mmol/L (ref 3.5–5.1)
Sodium: 139 mmol/L (ref 135–145)
Total Bilirubin: 1.1 mg/dL (ref 0.3–1.2)
Total Protein: 6.8 g/dL (ref 6.5–8.1)

## 2019-12-03 LAB — CBC WITH DIFFERENTIAL/PLATELET
Abs Immature Granulocytes: 0.01 10*3/uL (ref 0.00–0.07)
Basophils Absolute: 0.1 10*3/uL (ref 0.0–0.1)
Basophils Relative: 1 %
Eosinophils Absolute: 0 10*3/uL (ref 0.0–0.5)
Eosinophils Relative: 1 %
HCT: 33.8 % — ABNORMAL LOW (ref 39.0–52.0)
Hemoglobin: 10.6 g/dL — ABNORMAL LOW (ref 13.0–17.0)
Immature Granulocytes: 0 %
Lymphocytes Relative: 14 %
Lymphs Abs: 0.9 10*3/uL (ref 0.7–4.0)
MCH: 26.2 pg (ref 26.0–34.0)
MCHC: 31.4 g/dL (ref 30.0–36.0)
MCV: 83.5 fL (ref 80.0–100.0)
Monocytes Absolute: 0.5 10*3/uL (ref 0.1–1.0)
Monocytes Relative: 7 %
Neutro Abs: 4.8 10*3/uL (ref 1.7–7.7)
Neutrophils Relative %: 77 %
Platelets: 246 10*3/uL (ref 150–400)
RBC: 4.05 MIL/uL — ABNORMAL LOW (ref 4.22–5.81)
RDW: 14.1 % (ref 11.5–15.5)
WBC: 6.2 10*3/uL (ref 4.0–10.5)
nRBC: 0 % (ref 0.0–0.2)

## 2019-12-03 LAB — LIPASE, BLOOD: Lipase: 16 U/L (ref 11–51)

## 2019-12-03 MED ORDER — IOHEXOL 300 MG/ML  SOLN
100.0000 mL | Freq: Once | INTRAMUSCULAR | Status: AC | PRN
  Administered 2019-12-04: 100 mL via INTRAVENOUS

## 2019-12-03 NOTE — ED Provider Notes (Signed)
[facility] EMERGENCY DEPARTMENT Provider Note   CSN: [id number] Arrival date & time: 12/03/19  2229     History Chief Complaint  Patient presents with   Abdominal Pain    Jackson Bowen is a 69 y.o. male.  The history is provided by the patient.  Abdominal Pain Pain location:  RLQ Pain quality: fullness and pressure   Pain radiates to:  Does not radiate Pain severity:  Mild Onset quality:  Gradual Duration:  7 hours Timing:  Constant Progression:  Partially resolved Chronicity:  New Relieved by:  None tried Worsened by:  Eating (pt had gradual onset of RLQ distention and pressure this afternoon.  Ate a sandwich which caused multiple episodes of nonbloody emesis.) Ineffective treatments:  None tried Associated symptoms: nausea and vomiting   Associated symptoms: no chest pain, no chills, no constipation, no diarrhea, no fever, no shortness of breath and no sore throat   Associated symptoms comment:  Reports his urine looked red tonight.  Denies dysuria or increased urinary frequency.  Denies flank pain.  Risk factors: being elderly   Risk factors comment:  Proximal colectomy 2ndary to colon ca 2014.  No chemo/radiation, no h/o colostomy. No hx of kidney stones      Past Medical History:  Diagnosis Date   Colon cancer ([place])    Colon cancer ([place]) 02/24/2014   Diabetes mellitus without complication ([place])    High blood cholesterol    Hypertension     Patient Active Problem List   Diagnosis Date Noted   History of colon cancer 04/18/2015   Steatosis of liver 04/18/2015   Cancer of ascending colon ([place]) 02/24/2014    Past Surgical History:  Procedure Laterality Date   BACK SURGERY     for ruptured disc   CARPAL TUNNEL RELEASE     left hand   COLONOSCOPY N/A 04/30/2015   Procedure: COLONOSCOPY;  Surgeon: Danie Binder, MD;  [facility];  Service: Endoscopy;  Laterality: N/A;  1245   COLONOSCOPY N/A 06/29/2019   Procedure:  COLONOSCOPY;  Surgeon: Danie Binder, MD;  Location: [facility];  Service: Endoscopy;  Laterality: N/A;  8:30am   removal of cyst     right face   Segmental Colectomy     02/07/2013       Family History  Problem Relation Age of Onset   Diabetes Mother    Stroke Father    Cancer Sister    Colon cancer Neg Hx     Social History   Tobacco Use   Smoking status: Former Smoker    Packs/day: 1.00    Years: 15.00    Pack years: 15.00    Types: Cigarettes    Quit date: 04/17/1990    Years since quitting: 29.6   Smokeless tobacco: Never Used  Substance Use Topics   Alcohol use: No    Alcohol/week: 0.0 standard drinks   Drug use: No    Home Medications Prior to Admission medications   Medication Sig Start Date End Date Taking? Authorizing Provider  aspirin EC 81 MG tablet Take 81 mg by mouth daily.    [provider]  Cholecalciferol (VITAMIN D3) 50 MCG (2000 UT) TABS Take 2,000 Units by mouth daily.    [provider]  empagliflozin (JARDIANCE) 10 MG TABS tablet Take 10 mg by mouth every other day. In the morning    [provider]  LANTUS SOLOSTAR 100 UNIT/ML SOPN Inject 10 Units into the skin  at bedtime.  01/12/13   [provider]  losartan-hydrochlorothiazide (HYZAAR) 100-12.5 MG per tablet Take 1 tablet by mouth daily. 02/07/13   [provider]  lovastatin (MEVACOR) 40 MG tablet Take 40 mg by mouth every evening.  12/01/12   [provider]  metFORMIN (GLUCOPHAGE) 1000 MG tablet Take 1,000 mg by mouth 2 (two) times daily. 12/01/12   [provider]  Omega-3 Fatty Acids (FISH OIL) 500 MG CAPS Take 500 mg by mouth every other day. At night.    [provider]  UNABLE TO FIND Take 1 capsule by mouth daily. [place] Plus Dietary Supplement    [provider]  vitamin B-12 (CYANOCOBALAMIN) 500 MCG tablet Take 500 mcg by mouth daily.    [provider]  vitamin C (ASCORBIC ACID) 500  MG tablet Take 500 mg by mouth daily.    [provider]    Allergies    Patient has no known allergies.  Review of Systems   Review of Systems  Constitutional: Negative for chills and fever.  HENT: Negative for congestion and sore throat.   Eyes: Negative.   Respiratory: Negative for chest tightness and shortness of breath.   Cardiovascular: Negative for chest pain.  Gastrointestinal: Positive for abdominal distention, abdominal pain, nausea and vomiting. Negative for constipation and diarrhea.  Genitourinary: Negative.   Musculoskeletal: Negative for arthralgias, joint swelling and neck pain.  Skin: Negative.  Negative for rash and wound.  Neurological: Negative for dizziness, weakness, light-headedness, numbness and headaches.  Psychiatric/Behavioral: Negative.     Physical Exam Updated Vital Signs BP (!) 165/74    Pulse (!) 50    Temp 98.4 F (36.9 C) (Oral)    Resp 17    Ht 5\' 11"  (1.803 m)    Wt 89.8 kg    SpO2 99%    BMI 27.62 kg/m   Physical Exam Vitals and nursing note reviewed.  Constitutional:      Appearance: He is well-developed.  HENT:     Head: Normocephalic and atraumatic.  Eyes:     Conjunctiva/sclera: Conjunctivae normal.  Cardiovascular:     Rate and Rhythm: Normal rate and regular rhythm.     Heart sounds: Normal heart sounds.  Pulmonary:     Effort: Pulmonary effort is normal.     Breath sounds: Normal breath sounds. No wheezing.  Abdominal:     General: Bowel sounds are normal. There is distension.     Palpations: Abdomen is soft.     Tenderness: There is no abdominal tenderness.     Comments: Mild distention with increased tympany localizing to the RLQ.  No guarding or rebound. Decreased bowel sounds RLQ.  Musculoskeletal:        General: Normal range of motion.     Cervical back: Normal range of motion.  Skin:    General: Skin is warm and dry.  Neurological:     Mental Status: He is alert.     ED Results / Procedures /  Treatments   Labs (all labs ordered are listed, but only abnormal results are displayed) Labs Reviewed  COMPREHENSIVE METABOLIC PANEL - Abnormal; Notable for the following components:      Result Value   Glucose, Bld 112 (*)    Creatinine, Ser 1.32 (*)    GFR calc non Af Amer 54 (*)    All other components within normal limits  CBC WITH DIFFERENTIAL/PLATELET - Abnormal; Notable for the following components:   RBC 4.05 (*)  Hemoglobin 10.6 (*)    HCT 33.8 (*)    All other components within normal limits  URINALYSIS, ROUTINE W REFLEX MICROSCOPIC - Abnormal; Notable for the following components:   Color, Urine AMBER (*)    APPearance CLOUDY (*)    Hgb urine dipstick LARGE (*)    Protein, ur 100 (*)    RBC / HPF >50 (*)    All other components within normal limits  URINE CULTURE  LIPASE, BLOOD    EKG None  Radiology CT ABDOMEN PELVIS W CONTRAST  Result Date: 12/04/2019 CLINICAL DATA:  Right lower quadrant abdominal pain. History of colon cancer EXAM: CT ABDOMEN AND PELVIS WITH CONTRAST TECHNIQUE: Multidetector CT imaging of the abdomen and pelvis was performed using the standard protocol following bolus administration of intravenous contrast. CONTRAST:  151mL OMNIPAQUE IOHEXOL 300 MG/ML  SOLN COMPARISON:  CT 04/12/2015 FINDINGS: Lower chest: Minor lung base atelectasis. No focal airspace disease or pleural effusion. Coronary artery calcifications. Hepatobiliary: Diffusely decreased hepatic density consistent with steatosis. Minimal focal fatty infiltration adjacent to the falciform ligament. No focal lesion. Gallbladder physiologically distended, no calcified stone. No biliary dilatation. Pancreas: No ductal dilatation or inflammation. Spleen: Normal in size without focal abnormality. Adrenals/Urinary Tract: No adrenal nodule. Moderate right hydronephrosis. Moderate right perinephric edema. There is absent right renal excretion on delayed phase imaging. No significant ureteral  dilatation. No evidence of ureteral calculus. Question of slight right ureteral enhancement. Urinary bladder is completely empty, question of mild perivesicular fat stranding. No left hydronephrosis or perinephric edema. Homogeneous left renal enhancement. 15 mm cyst from the posterior left kidney. Stomach/Bowel: Nondistended stomach. No small bowel obstruction or inflammation. Prior right hemicolectomy with anastomosis in the right upper quadrant. Moderate stool in the proximal transverse colon. Fairly abrupt transition to less stool-filled transverse colon in the midportion, series 2, image 37 with short segment luminal narrowing versus peristalsis. Small volume of stool in the more distal colon. No colonic wall thickening or inflammation. Submucosal fatty infiltration of the sigmoid colon suggest remote or prior inflammation. Vascular/Lymphatic: Aortic and branch atherosclerosis. No aortic aneurysm. The previous enlarged mesenteric lymph node in the right abdomen is no longer seen. No new enlarged lymph nodes. Portal vein is patent. Reproductive: Prominent prostate gland spans 5.2 cm transverse. Other: No ascites or free air. Small fat containing umbilical hernia. Musculoskeletal: There are no acute or suspicious osseous abnormalities. Degenerative change in the spine. No focal bone lesion. IMPRESSION: 1. Moderate right hydronephrosis with perinephric edema and question of mild proximal ureteral enhancement. No obstructing ureteral stone or lesion. There is question of mild perivesicular fat stranding, which raises possibility of cystitis and ascending urinary tract infection. 2. Post right hemicolectomy. Short segment colonic narrowing versus area of peristalsis involving the mid transverse colon. 3. Hepatic steatosis. Aortic Atherosclerosis (ICD10-I70.0). Electronically Signed   By: Keith Rake M.D.   On: 12/04/2019 01:03    Procedures Procedures (including critical care time)  Medications Ordered in  ED Medications  ketorolac (TORADOL) 30 MG/ML injection 15 mg (has no administration in time range)  iohexol (OMNIPAQUE) 300 MG/ML solution 100 mL (100 mLs Intravenous Contrast Given 12/04/19 0028)  morphine 4 MG/ML injection 4 mg (4 mg Intravenous Given 12/04/19 0020)  ondansetron (ZOFRAN) injection 4 mg (4 mg Intravenous Given 12/04/19 0019)  promethazine (PHENERGAN) injection 12.5 mg (12.5 mg Intravenous Given 12/04/19 0055)    ED Course  I have reviewed the triage vital signs and the nursing notes.  Pertinent labs & imaging results  that were available during my care of the patient were reviewed by me and considered in my medical decision making (see chart for details).    MDM Rules/Calculators/A&P                      1:27 AM Pt now with escalating pain still localized RLQ - morphine and zofran ordered.  Labs reviewed and normal wbc count, hgb 10.6, down from 12.8 in 2017.   Creatinine 1.32, from 1.07 in 2017.   1:27 AM Pt back from CT, pain improved, nausea worsened.  Given phenergan 12.5 IV, drowsy but currently sx free. Toradol ordered given suspected kidney stone pain with hydronephrosis, no active ureteral stone but either has passed or too small to catch on Ct imaging.    Discussed with Dr. Dina Rich who will dispo pt.   Final Clinical Impression(s) / ED Diagnoses Final diagnoses:  Hydronephrosis, unspecified hydronephrosis type  Gross hematuria    Rx / DC Orders ED Discharge Orders    None       Landis Martins 12/04/19 0127    Merryl Hacker, MD 12/04/19 [phone]

## 2019-12-03 NOTE — ED Triage Notes (Signed)
Pt here via [facility], c/o of right lower abd pain. Hs of colon CA in the same area.

## 2019-12-04 LAB — URINALYSIS, ROUTINE W REFLEX MICROSCOPIC
Bacteria, UA: NONE SEEN
Bilirubin Urine: NEGATIVE
Glucose, UA: NEGATIVE mg/dL
Ketones, ur: NEGATIVE mg/dL
Leukocytes,Ua: NEGATIVE
Nitrite: NEGATIVE
Protein, ur: 100 mg/dL — AB
RBC / HPF: >50 RBC/hpf — ABNORMAL HIGH (ref 0–5)
Specific Gravity, Urine: 1.018 (ref 1.005–1.030)
pH: 6 (ref 5.0–8.0)

## 2019-12-04 MED ORDER — MORPHINE SULFATE (PF) 4 MG/ML IV SOLN
4.0000 mg | Freq: Once | INTRAVENOUS | Status: AC
  Administered 2019-12-04: 4 mg via INTRAVENOUS
  Filled 2019-12-04: qty 1

## 2019-12-04 MED ORDER — KETOROLAC TROMETHAMINE 30 MG/ML IJ SOLN
15.0000 mg | Freq: Once | INTRAMUSCULAR | Status: AC
  Administered 2019-12-04: 02:00:00 15 mg via INTRAVENOUS
  Filled 2019-12-04: qty 1

## 2019-12-04 MED ORDER — ONDANSETRON HCL 4 MG/2ML IJ SOLN
4.0000 mg | Freq: Once | INTRAMUSCULAR | Status: AC
  Administered 2019-12-04: 4 mg via INTRAVENOUS
  Filled 2019-12-04: qty 2

## 2019-12-04 MED ORDER — PROMETHAZINE HCL 25 MG/ML IJ SOLN
12.5000 mg | Freq: Once | INTRAMUSCULAR | Status: AC
  Administered 2019-12-04: 12.5 mg via INTRAVENOUS
  Filled 2019-12-04: qty 1

## 2019-12-04 NOTE — ED Notes (Signed)
Pt resting comfortably at this time. Currently pt is waiting on ride to get here. His sister "can't see well in the dark and she will be here after daylight."

## 2019-12-04 NOTE — Discharge Instructions (Addendum)
You were seen today for abdominal pain and hematuria.  You likely recently passed a kidney stone.  There is evidence of some hydronephrosis on your exam as well as irritation of the ureter.

## 2019-12-05 LAB — URINE CULTURE: Culture: NO GROWTH

## 2021-10-22 ENCOUNTER — Encounter: Payer: Self-pay | Admitting: Orthopaedic Surgery

## 2021-11-22 ENCOUNTER — Encounter: Payer: Self-pay | Admitting: Orthopedic Surgery

## 2021-11-22 ENCOUNTER — Other Ambulatory Visit: Payer: Self-pay

## 2021-11-22 ENCOUNTER — Ambulatory Visit: Payer: Medicare Other | Admitting: Orthopedic Surgery

## 2021-11-22 VITALS — BP 144/71 | HR 65 | Ht 71.0 in | Wt 182.0 lb

## 2021-11-22 DIAGNOSIS — S76012A Strain of muscle, fascia and tendon of left hip, initial encounter: Secondary | ICD-10-CM | POA: Diagnosis not present

## 2021-11-22 MED ORDER — NAPROXEN 500 MG PO TABS: 500.0000 mg | ORAL_TABLET | Freq: Two times a day (BID) | ORAL | 1 refills | Status: AC

## 2021-11-22 NOTE — Progress Notes (Signed)
New Patient Visit  Assessment: Jackson Bowen is a 71 y.o. male with the following: 1. Strain of flexor muscle of left hip, initial encounter  Plan: Patient's pain appears to be muscular at this time.  Radiographs were reviewed with the patient, which does demonstrate some mild degenerative changes in the left hip, more than the right hip.  His pain is in the groin.  However he does have tenderness to palpation towards the AIIS and ASIS.  As such, we discussed multiple treatment options including medications, home exercises versus formal physical therapy.  He would like to proceed with medications and home exercises.  If he continues to have issues, we can consider an injection.  No follow-up is needed at this time.   Follow-up: Return if symptoms worsen or fail to improve.  Subjective:  Chief Complaint  Patient presents with   Hip Pain    Lt side hip pain for a few months.     History of Present Illness: Jackson Bowen is a 71 y.o. male who presents for evaluation of left hip pain.  He localizes the pain to the groin area, radiating laterally.  No specific injury.  He does note that he had some back pain, which caused his left foot to turn in, due to the pain.  That gradually resolved, but then he started to have left hip and groin pain.  He states he is very active, walking at least 30 minutes every day.  Otherwise, no exercises for his left hip.  He has never injured his left hip.  He has no tenderness over the lateral hip.  Pain gets worse with certain motions.   Review of Systems: No fevers or chills No numbness or tingling No chest pain No shortness of breath No bowel or bladder dysfunction No GI distress No headaches   Medical History:  Past Medical History:  Diagnosis Date   Colon cancer ([place])    Colon cancer ([place]) 02/24/2014   Diabetes mellitus without complication ([facility])    High blood cholesterol    Hypertension     Past Surgical History:  Procedure  Laterality Date   BACK SURGERY     for ruptured disc   CARPAL TUNNEL RELEASE     left hand   COLONOSCOPY N/A 04/30/2015   Procedure: COLONOSCOPY;  Surgeon: Danie Binder, MD;  [facility];  Service: Endoscopy;  Laterality: N/A;  1245   COLONOSCOPY N/A 06/29/2019   Procedure: COLONOSCOPY;  Surgeon: Danie Binder, MD;  [facility];  Service: Endoscopy;  Laterality: N/A;  8:30am   removal of cyst     right face   Segmental Colectomy     02/07/2013    Family History  Problem Relation Age of Onset   Diabetes Mother    Stroke Father    Cancer Sister    Colon cancer Neg Hx    Social History   Tobacco Use   Smoking status: Former    Packs/day: 1.00    Years: 15.00    Pack years: 15.00    Types: Cigarettes    Quit date: 04/17/1990    Years since quitting: 31.6   Smokeless tobacco: Never  Vaping Use   Vaping Use: Never used  Substance Use Topics   Alcohol use: No    Alcohol/week: 0.0 standard drinks   Drug use: No    No Known Allergies  Current Meds  Medication Sig   naproxen (NAPROSYN) 500 MG tablet Take 1  tablet (500 mg total) by mouth 2 (two) times daily with a meal.    Objective: BP (!) 144/71    Pulse 65    Ht 5\' 11"  (1.803 m)    Wt 182 lb (82.6 kg)    BMI 25.38 kg/m   Physical Exam:  General: Alert and oriented. and No acute distress. Gait: Left sided antalgic gait.  Evaluation left hip demonstrates no deformity.  No atrophy is appreciated.  Tenderness to palpation proximal to the hip joint and groin area.  He has some pain in the front of the hip with hip flexion, against gravity.  Pain with resisted hip flexion.  External and internal rotation of his left hip is within normal limits.  IMAGING: I personally reviewed images previously obtained in clinic  X-rays left hip with mild degenerative changes overall, slight loss of joint space.  Minimal osteophytes.   New Medications:  Meds ordered this encounter  Medications   naproxen  (NAPROSYN) 500 MG tablet    Sig: Take 1 tablet (500 mg total) by mouth 2 (two) times daily with a meal.    Dispense:  60 tablet    Refill:  1      Mordecai Rasmussen, MD  11/22/2021 11:59 AM  aproxen

## 2022-06-23 ENCOUNTER — Emergency Department (HOSPITAL_COMMUNITY): Payer: Medicare Other

## 2022-06-23 ENCOUNTER — Encounter (HOSPITAL_COMMUNITY): Payer: Self-pay

## 2022-06-23 ENCOUNTER — Emergency Department (HOSPITAL_COMMUNITY)
Admission: EM | Admit: 2022-06-23 | Discharge: 2022-06-23 | Disposition: A | Discharge status: Home / Self Care | Payer: Medicare Other | Attending: Emergency Medicine | Admitting: Emergency Medicine

## 2022-06-23 DIAGNOSIS — Z7984 Long term (current) use of oral hypoglycemic drugs: Secondary | ICD-10-CM | POA: Insufficient documentation

## 2022-06-23 DIAGNOSIS — Z79899 Other long term (current) drug therapy: Secondary | ICD-10-CM | POA: Insufficient documentation

## 2022-06-23 DIAGNOSIS — Z7982 Long term (current) use of aspirin: Secondary | ICD-10-CM | POA: Diagnosis not present

## 2022-06-23 DIAGNOSIS — D72829 Elevated white blood cell count, unspecified: Secondary | ICD-10-CM | POA: Insufficient documentation

## 2022-06-23 DIAGNOSIS — N132 Hydronephrosis with renal and ureteral calculous obstruction: Secondary | ICD-10-CM | POA: Diagnosis not present

## 2022-06-23 DIAGNOSIS — R112 Nausea with vomiting, unspecified: Secondary | ICD-10-CM | POA: Diagnosis present

## 2022-06-23 DIAGNOSIS — Z85038 Personal history of other malignant neoplasm of large intestine: Secondary | ICD-10-CM | POA: Insufficient documentation

## 2022-06-23 DIAGNOSIS — R5383 Other fatigue: Secondary | ICD-10-CM | POA: Insufficient documentation

## 2022-06-23 DIAGNOSIS — E119 Type 2 diabetes mellitus without complications: Secondary | ICD-10-CM | POA: Insufficient documentation

## 2022-06-23 DIAGNOSIS — I10 Essential (primary) hypertension: Secondary | ICD-10-CM | POA: Diagnosis not present

## 2022-06-23 DIAGNOSIS — N200 Calculus of kidney: Secondary | ICD-10-CM

## 2022-06-23 LAB — URINALYSIS, ROUTINE W REFLEX MICROSCOPIC
Bilirubin Urine: NEGATIVE
Glucose, UA: NEGATIVE mg/dL
Ketones, ur: NEGATIVE mg/dL
Nitrite: NEGATIVE
Protein, ur: 100 mg/dL — AB
RBC / HPF: >50 RBC/hpf — ABNORMAL HIGH (ref 0–5)
Specific Gravity, Urine: 1.004 — ABNORMAL LOW (ref 1.005–1.030)
pH: 6 (ref 5.0–8.0)

## 2022-06-23 LAB — CBG MONITORING, ED: Glucose-Capillary: 100 mg/dL — ABNORMAL HIGH (ref 70–99)

## 2022-06-23 LAB — CBC
HCT: 37.1 % — ABNORMAL LOW (ref 39.0–52.0)
Hemoglobin: 12.2 g/dL — ABNORMAL LOW (ref 13.0–17.0)
MCH: 27.4 pg (ref 26.0–34.0)
MCHC: 32.9 g/dL (ref 30.0–36.0)
MCV: 83.4 fL (ref 80.0–100.0)
Platelets: 279 10*3/uL (ref 150–400)
RBC: 4.45 MIL/uL (ref 4.22–5.81)
RDW: 12.8 % (ref 11.5–15.5)
WBC: 6 10*3/uL (ref 4.0–10.5)
nRBC: 0 % (ref 0.0–0.2)

## 2022-06-23 LAB — COMPREHENSIVE METABOLIC PANEL
ALT: 10 U/L (ref 0–44)
AST: 17 U/L (ref 15–41)
Albumin: 4 g/dL (ref 3.5–5.0)
Alkaline Phosphatase: 59 U/L (ref 38–126)
Anion gap: 7 (ref 5–15)
BUN: 18 mg/dL (ref 8–23)
CO2: 29 mmol/L (ref 22–32)
Calcium: 9.4 mg/dL (ref 8.9–10.3)
Chloride: 103 mmol/L (ref 98–111)
Creatinine, Ser: 2.16 mg/dL — ABNORMAL HIGH (ref 0.61–1.24)
GFR, Estimated: 32 mL/min — ABNORMAL LOW (ref >60)
Glucose, Bld: 105 mg/dL — ABNORMAL HIGH (ref 70–99)
Potassium: 3.7 mmol/L (ref 3.5–5.1)
Sodium: 139 mmol/L (ref 135–145)
Total Bilirubin: 0.9 mg/dL (ref 0.3–1.2)
Total Protein: 7.6 g/dL (ref 6.5–8.1)

## 2022-06-23 LAB — CK: Total CK: 91 U/L (ref 49–397)

## 2022-06-23 LAB — LIPASE, BLOOD: Lipase: 25 U/L (ref 11–51)

## 2022-06-23 MED ORDER — ONDANSETRON 4 MG PO TBDP: 4.0000 mg | ORAL_TABLET | Freq: Three times a day (TID) | ORAL | 0 refills | Status: DC | PRN

## 2022-06-23 MED ORDER — SULFAMETHOXAZOLE-TRIMETHOPRIM 800-160 MG PO TABS
1.0000 | ORAL_TABLET | Freq: Once | ORAL | Status: AC
  Administered 2022-06-23: 1 via ORAL
  Filled 2022-06-23: qty 1

## 2022-06-23 MED ORDER — SODIUM CHLORIDE 0.9 % IV BOLUS
1000.0000 mL | Freq: Once | INTRAVENOUS | Status: AC
  Administered 2022-06-23: 1000 mL via INTRAVENOUS

## 2022-06-23 MED ORDER — TAMSULOSIN HCL 0.4 MG PO CAPS: 0.4000 mg | ORAL_CAPSULE | Freq: Every day | ORAL | 0 refills | Status: AC

## 2022-06-23 MED ORDER — SULFAMETHOXAZOLE-TRIMETHOPRIM 800-160 MG PO TABS: 1.0000 | ORAL_TABLET | Freq: Two times a day (BID) | ORAL | 0 refills | Status: AC

## 2022-06-23 MED ORDER — OXYCODONE-ACETAMINOPHEN 5-325 MG PO TABS: 1.0000 | ORAL_TABLET | Freq: Three times a day (TID) | ORAL | 0 refills | Status: AC | PRN

## 2022-06-23 NOTE — Discharge Instructions (Addendum)
You came to the emergency department today to be evaluated for your nausea and vomiting.  The CT scan obtained shows that you have a kidney stone to your right ureter with concern for possible infection.  Due to this you were started on the antibiotic Bactrim.  Please take this medication as prescribed.  Additionally I prescribed you with Flomax to take daily for the next of days to help pass your stone.  Have also given you pain medication to use as needed.  Please call the urologist listed on this paperwork tomorrow to schedule a follow-up appointment.  Get help right away if: You have a fever or chills. You develop severe pain. You develop new abdominal pain. You faint. You are unable to urinate.

## 2022-06-23 NOTE — ED Triage Notes (Addendum)
Pt states that he had multiple bouts of emesis on Friday after eating chicken livers. Pt states that he has not had much of an appetite over the weekend. Pt states that today he ate a sandwich, but is still feeling run down- tired, dizzy, not up to regular 30 mins of walking. Pt states dark urine. Concerned for dehydration

## 2022-06-23 NOTE — ED Provider Notes (Signed)
[facility] EMERGENCY DEPARTMENT Provider Note   CSN: [id number] Arrival date & time: 06/23/22  1724     History {Add pertinent medical, surgical, social history, OB history to HPI:1} Chief Complaint  Patient presents with   Emesis    Jackson Bowen is a 71 y.o. male with a history of diabetes mellitus, hypertension, colon cancer.  Presents to the emergency department with a chief complaint of nausea, vomiting, fatigue and dark-colored urine.  Patient states that he was outside on Friday doing a lot of yard work.  Patient reports that after dinner that night he had nausea and vomiting.  Patient has had persistent nausea and vomiting since then.  Due to his nausea and vomiting he has had decreased p.o. intake.  Patient states that he vomited twice in the last 24 hours.  Last emesis occurred this morning approximately 5 AM.  Patient reports that emesis at that time was stomach contents.  Patient reports that he had diffuse abdominal pain with vomiting however no pain outside of episodes of vomiting.  Patient states that he was able to eat a "chopped barbecue tray," today around noon without any nausea or vomiting.  Patient reports that he just felt overall weak since his nausea vomiting began on Friday.  Patient states that yesterday he started having dark-colored urine.  Denies any illicit drug or alcohol use.  Denies any fever, chills, constipation, diarrhea, blood in stool, melena, dysuria, hematuria, urinary urgency, urinary frequency, swelling or tenderness to genitals, genital sores or lesion, no emesis, call for an emesis.   Emesis Associated symptoms: no abdominal pain, no chills, no diarrhea, no fever and no headaches        Home Medications Prior to Admission medications   Medication Sig Start Date End Date Taking? Authorizing Provider  aspirin EC 81 MG tablet Take 81 mg by mouth daily.    [provider]  Cholecalciferol (VITAMIN D3) 50 MCG (2000 UT) TABS Take  2,000 Units by mouth daily.    [provider]  empagliflozin (JARDIANCE) 10 MG TABS tablet Take 10 mg by mouth every other day. In the morning    [provider]  LANTUS SOLOSTAR 100 UNIT/ML SOPN Inject 10 Units into the skin at bedtime.  01/12/13   [provider]  losartan-hydrochlorothiazide (HYZAAR) 100-12.5 MG per tablet Take 1 tablet by mouth daily. 02/07/13   [provider]  lovastatin (MEVACOR) 40 MG tablet Take 40 mg by mouth every evening.  12/01/12   [provider]  metFORMIN (GLUCOPHAGE) 1000 MG tablet Take 1,000 mg by mouth 2 (two) times daily. 12/01/12   [provider]  naproxen (NAPROSYN) 500 MG tablet Take 1 tablet (500 mg total) by mouth 2 (two) times daily with a meal. 11/22/21   Mordecai Rasmussen, MD  Omega-3 Fatty Acids (FISH OIL) 500 MG CAPS Take 500 mg by mouth every other day. At night.    [provider]  UNABLE TO FIND Take 1 capsule by mouth daily. [place] Plus Dietary Supplement    [provider]  vitamin B-12 (CYANOCOBALAMIN) 500 MCG tablet Take 500 mcg by mouth daily.    [provider]  vitamin C (ASCORBIC ACID) 500 MG tablet Take 500 mg by mouth daily.    [provider]      Allergies    Patient has no known allergies.    Review of Systems   Review of Systems  Constitutional:  Positive for fatigue. Negative for chills and  fever.  Eyes:  Negative for visual disturbance.  Respiratory:  Negative for shortness of breath.   Cardiovascular:  Negative for chest pain.  Gastrointestinal:  Positive for nausea and vomiting. Negative for abdominal distention, abdominal pain, anal bleeding, blood in stool, constipation, diarrhea and rectal pain.  Genitourinary:  Negative for difficulty urinating, dysuria, flank pain, frequency, genital sores, hematuria, penile discharge, penile pain, penile swelling, scrotal swelling, testicular pain and urgency.  Musculoskeletal:  Negative for back pain  and neck pain.  Skin:  Negative for color change and rash.  Neurological:  Negative for dizziness, syncope, light-headedness and headaches.  Psychiatric/Behavioral:  Negative for confusion.     Physical Exam Updated Vital Signs BP (!) 145/66   Pulse 65   Temp 98.3 F (36.8 C) (Oral)   Resp 18   Ht '5\' 11"'$  (1.803 m)   Wt 84.3 kg   SpO2 100%   BMI 25.92 kg/m  Physical Exam Vitals and nursing note reviewed.  Constitutional:      General: He is not in acute distress.    Appearance: He is not ill-appearing, toxic-appearing or diaphoretic.  HENT:     Head: Normocephalic.  Eyes:     General: No scleral icterus.       Right eye: No discharge.        Left eye: No discharge.  Cardiovascular:     Rate and Rhythm: Normal rate.  Pulmonary:     Effort: Pulmonary effort is normal.  Abdominal:     General: Abdomen is flat. Bowel sounds are normal. There is no distension. There are no signs of injury.     Palpations: Abdomen is soft. There is no mass or pulsatile mass.     Tenderness: There is no abdominal tenderness. There is no right CVA tenderness, left CVA tenderness, guarding or rebound.     Hernia: There is no hernia in the umbilical area or ventral area.  Skin:    General: Skin is warm and dry.  Neurological:     General: No focal deficit present.     Mental Status: He is alert.  Psychiatric:        Behavior: Behavior is cooperative.     ED Results / Procedures / Treatments   Labs (all labs ordered are listed, but only abnormal results are displayed) Labs Reviewed  COMPREHENSIVE METABOLIC PANEL - Abnormal; Notable for the following components:      Result Value   Glucose, Bld 105 (*)    Creatinine, Ser 2.16 (*)    GFR, Estimated 32 (*)    All other components within normal limits  CBC - Abnormal; Notable for the following components:   Hemoglobin 12.2 (*)    HCT 37.1 (*)    All other components within normal limits  URINALYSIS, ROUTINE W REFLEX MICROSCOPIC -  Abnormal; Notable for the following components:   Color, Urine AMBER (*)    APPearance CLOUDY (*)    Specific Gravity, Urine 1.004 (*)    Hgb urine dipstick LARGE (*)    Protein, ur 100 (*)    Leukocytes,Ua MODERATE (*)    RBC / HPF >50 (*)    Bacteria, UA RARE (*)    All other components within normal limits  CBG MONITORING, ED - Abnormal; Notable for the following components:   Glucose-Capillary 100 (*)    All other components within normal limits  LIPASE, BLOOD    EKG None  Radiology No results found.  Procedures Procedures  {Document cardiac monitor,  telemetry assessment procedure when appropriate:1}  Medications Ordered in ED Medications - No data to display  ED Course/ Medical Decision Making/ A&P                           Medical Decision Making Amount and/or Complexity of Data Reviewed Labs: ordered. Radiology: ordered.   ***  Per chart review patient had cervical hematuria and hydronephrosis January 2021 which was attributed to renal calculus.  {Document critical care time when appropriate:1} {Document review of labs and clinical decision tools ie heart score, Chads2Vasc2 etc:1}  {Document your independent review of radiology images, and any outside records:1} {Document your discussion with family members, caretakers, and with consultants:1} {Document social determinants of health affecting pt's care:1} {Document your decision making why or why not admission, treatments were needed:1} Final Clinical Impression(s) / ED Diagnoses Final diagnoses:  None    Rx / DC Orders ED Discharge Orders     None

## 2022-07-01 ENCOUNTER — Ambulatory Visit (HOSPITAL_COMMUNITY)
Admission: RE | Admit: 2022-07-01 | Discharge: 2022-07-01 | Disposition: A | Discharge status: Home / Self Care | Payer: Medicare Other | Source: Ambulatory Visit | Attending: Urology | Admitting: Urology

## 2022-07-01 ENCOUNTER — Ambulatory Visit: Payer: Medicare Other | Admitting: Urology

## 2022-07-01 ENCOUNTER — Encounter: Payer: Self-pay | Admitting: Urology

## 2022-07-01 ENCOUNTER — Other Ambulatory Visit: Payer: Self-pay

## 2022-07-01 VITALS — BP 108/57 | HR 83

## 2022-07-01 DIAGNOSIS — N200 Calculus of kidney: Secondary | ICD-10-CM | POA: Insufficient documentation

## 2022-07-01 DIAGNOSIS — N202 Calculus of kidney with calculus of ureter: Secondary | ICD-10-CM

## 2022-07-01 DIAGNOSIS — N1330 Unspecified hydronephrosis: Secondary | ICD-10-CM | POA: Diagnosis not present

## 2022-07-01 LAB — URINALYSIS, ROUTINE W REFLEX MICROSCOPIC
Bilirubin, UA: NEGATIVE
Glucose, UA: NEGATIVE
Ketones, UA: NEGATIVE
Leukocytes,UA: NEGATIVE
Nitrite, UA: NEGATIVE
Protein,UA: NEGATIVE
RBC, UA: NEGATIVE
Specific Gravity, UA: <1.005 — ABNORMAL LOW (ref 1.005–1.030)
Urobilinogen, Ur: 0.2 mg/dL (ref 0.2–1.0)
pH, UA: 5.5 (ref 5.0–7.5)

## 2022-07-01 MED ORDER — HYDROCODONE-ACETAMINOPHEN 5-325 MG PO TABS: 1.0000 | ORAL_TABLET | ORAL | 0 refills | Status: AC | PRN

## 2022-07-01 MED ORDER — TAMSULOSIN HCL 0.4 MG PO CAPS: 0.4000 mg | ORAL_CAPSULE | Freq: Every day | ORAL | 1 refills | Status: AC

## 2022-07-01 MED ORDER — ONDANSETRON 4 MG PO TBDP: 4.0000 mg | ORAL_TABLET | Freq: Three times a day (TID) | ORAL | 0 refills | Status: AC | PRN

## 2022-07-01 NOTE — Progress Notes (Signed)
07/01/2022 9:12 AM   Jackson Bowen July 16, 1951 [id number]  Referring provider: Jaynee Eagles, MD [address],  [place] [address]  nephrolithiasis   HPI: Jackson Bowen is a 71yo here for evaluation of nephrolithiasis. Starting 10 days ago he developed mild right flank pain which worsened and he presented to the ER on 8/21. He was diagnosed with a 63m right proximal/mid ureteral calculus. He denies any LUTS. No hematuria., KUB today shows a 6-78mright mid ureteral calculus.    PMH: Past Medical History:  Diagnosis Date   [place]on [place] ([facility]   [place]   Diabetes mellitus without complication (HCC)    High blood cholesterol    Hypertension     Surgical History: Past Surgical History:  Procedure Laterality Date   BACK SURGERY     for ruptured disc   CARPAL TUNNEL RELEASE     left hand   COLONOSCOPY N/A 04/30/2015   Procedure: COLONOSCOPY;  Surgeon: SaDanie BinderMD;  [facility];  Service: Endoscopy;  Laterality: N/A;  1245   COLONOSCOPY N/A 06/29/2019   Procedure: COLONOSCOPY;  Surgeon: FiDanie BinderMD;  [facility];  Service: Endoscopy;  Laterality: N/A;  8:30am   removal of cyst     right face   Segmental Colectomy     02/07/2013    Home Medications:  Allergies as of 07/01/2022   No Known Allergies      Medication List        Accurate as of July 01, 2022  9:12 AM. If you have any questions, ask your nurse or doctor.          ascorbic acid 500 MG tablet Commonly known as: VITAMIN C Take 500 mg by mouth daily.   aspirin EC 81 MG tablet Take 81 mg by mouth daily.   cyanocobalamin 500 MCG tablet Commonly known as: VITAMIN B12 Take 500 mcg by mouth daily.   Fish Oil 500 MG Caps Take 500 mg by mouth every other day. At night.   Jardiance 10 MG Tabs tablet Generic drug: empagliflozin Take 10 mg by mouth every other day. In the morning   Lantus SoloStar 100 UNIT/ML Solostar  Pen Generic drug: insulin glargine Inject 10 Units into the skin at bedtime.   losartan-hydrochlorothiazide 100-12.5 MG tablet Commonly known as: HYZAAR Take 1 tablet by mouth daily.   lovastatin 40 MG tablet Commonly known as: MEVACOR Take 40 mg by mouth every evening.   metFORMIN 1000 MG tablet Commonly known as: GLUCOPHAGE Take 1,000 mg by mouth 2 (two) times daily.   naproxen 500 MG tablet Commonly known as: NAPROSYN Take 1 tablet (500 mg total) by mouth 2 (two) times daily with a meal.   ondansetron 4 MG disintegrating tablet Commonly known as: ZOFRAN-ODT Take 1 tablet (4 mg total) by mouth every 8 (eight) hours as needed for nausea or vomiting.   UNABLE TO FIND Take 1 capsule by mouth daily. [place] Plus Dietary Supplement   Vitamin D3 50 MCG (2000 UT) Tabs Take 2,000 Units by mouth daily.        Allergies: No Known Allergies  Family History: Family History  Problem Relation Age of Onset   Diabetes Mother    Stroke Father    Cancer Sister    Colon cancer Neg Hx     Social History:  reports that he quit smoking about 32 years ago. His smoking use included  cigarettes. He has a 15.00 pack-year smoking history. He has never used smokeless tobacco. He reports that he does not drink alcohol and does not use drugs.  ROS: All other review of systems were reviewed and are negative except what is noted above in HPI  Physical Exam: BP (!) 108/57   Pulse 83   Constitutional:  Alert and oriented, No acute distress. HEENT: [place] AT, moist mucus membranes.  Trachea midline, no masses. Cardiovascular: No clubbing, cyanosis, or edema. Respiratory: Normal respiratory effort, no increased work of breathing. GI: Abdomen is soft, nontender, nondistended, no abdominal masses GU: No CVA tenderness.  Lymph: No cervical or inguinal lymphadenopathy. Skin: No rashes, bruises or suspicious lesions. Neurologic: Grossly intact, no focal deficits, moving all 4  extremities. Psychiatric: Normal mood and affect.  Laboratory Data: Lab Results  Component Value Date   WBC 6.0 06/23/2022   HGB 12.2 (L) 06/23/2022   HCT 37.1 (L) 06/23/2022   MCV 83.4 06/23/2022   PLT 279 06/23/2022    Lab Results  Component Value Date   CREATININE 2.16 (H) 06/23/2022    No results found for: "PSA"  No results found for: "TESTOSTERONE"  No results found for: "HGBA1C"  Urinalysis    Component Value Date/Time   COLORURINE AMBER (A) 06/23/2022 1756   APPEARANCEUR CLOUDY (A) 06/23/2022 1756   LABSPEC 1.004 (L) 06/23/2022 1756   PHURINE 6.0 06/23/2022 1756   GLUCOSEU NEGATIVE 06/23/2022 1756   HGBUR LARGE (A) 06/23/2022 1756   BILIRUBINUR NEGATIVE 06/23/2022 1756   KETONESUR NEGATIVE 06/23/2022 1756   PROTEINUR 100 (A) 06/23/2022 1756   NITRITE NEGATIVE 06/23/2022 1756   LEUKOCYTESUR MODERATE (A) 06/23/2022 1756    Lab Results  Component Value Date   BACTERIA RARE (A) 06/23/2022    Pertinent Imaging: CT 8/21 and KUB today: Images reviewed and discussed with the patient No results found for this or any previous visit.  No results found for this or any previous visit.  No results found for this or any previous visit.  No results found for this or any previous visit.  No results found for this or any previous visit.  No results found for this or any previous visit.  No results found for this or any previous visit.  Results for orders placed during the hospital encounter of 06/23/22  CT Renal Stone Study  Narrative CLINICAL DATA:  Flank pain, kidney stone suspected  EXAM: CT ABDOMEN AND PELVIS WITHOUT CONTRAST  TECHNIQUE: Multidetector CT imaging of the abdomen and pelvis was performed following the standard protocol without IV contrast.  RADIATION DOSE REDUCTION: This exam was performed according to the departmental dose-optimization program which includes automated exposure control, adjustment of the mA and/or kV according  to patient size and/or use of iterative reconstruction technique.  COMPARISON:  Contrast enhanced CT 12/04/2019  FINDINGS: Lower chest: No acute airspace disease or pleural effusion. Punctate calcified granuloma in the right lower lobe.  Hepatobiliary: Mild hepatic steatosis. No focal lesion on this unenhanced exam layering density in the gallbladder may represent sludge or stones. No pericholecystic inflammation. No biliary dilatation.  Pancreas: No ductal dilatation or inflammation.  Spleen: Normal in size without focal abnormality.  Adrenals/Urinary Tract: No adrenal nodule. Two adjacent versus a single obstructing stone measuring 8 mm is seen in the right mid distal ureter with moderate hydroureteronephrosis. There are multiple additional nonobstructing intrarenal calculi on the right. Mild right perinephric edema. There are no left renal calculi. 15 mm left renal cyst. This needs no  further follow-up imaging is recommended. Urinary bladder is partially distended. No bladder stone or wall thickening.  Stomach/Bowel: Decompressed stomach. There is no small bowel obstruction or inflammation. Post right colectomy. No colonic inflammatory change.  Vascular/Lymphatic: Moderate aortic and branch atherosclerosis. No aortic aneurysm. There are no enlarged lymph nodes in the abdomen or pelvis.  Reproductive: Enlarged prostate gland spans 5.2 cm transverse.  Other: Small fat containing umbilical hernia. No ascites. No free air.  Musculoskeletal: Lumbar degenerative change with multilevel degenerative disc disease and vacuum phenomenon. There are no acute or suspicious osseous abnormalities.  IMPRESSION: 1. Two adjacent versus a single 8 mm obstructing stone in the right mid distal ureter with moderate hydronephrosis. 2. Multiple additional nonobstructing intrarenal calculi on the right. 3. Hepatic steatosis. 4. Enlarged prostate gland.  Aortic Atherosclerosis  (ICD10-I70.0).   Electronically Signed By: Keith Rake M.D. On: 06/23/2022 21:41   Assessment & Plan:    1. Kidney stones -We discussed the management of kidney stones. These options include observation, ureteroscopy, shockwave lithotripsy (ESWL) and percutaneous nephrolithotomy (PCNL). We discussed which options are relevant to the patient's stone(s). We discussed the natural history of kidney stones as well as the complications of untreated stones and the impact on quality of life without treatment as well as with each of the above listed treatments. We also discussed the efficacy of each treatment in its ability to clear the stone burden. With any of these management options I discussed the signs and symptoms of infection and the need for emergent treatment should these be experienced. For each option we discussed the ability of each procedure to clear the patient of their stone burden.   For observation I described the risks which include but are not limited to silent renal damage, life-threatening infection, need for emergent surgery, failure to pass stone and pain.   For ureteroscopy I described the risks which include bleeding, infection, damage to contiguous structures, positioning injury, ureteral stricture, ureteral avulsion, ureteral injury, need for prolonged ureteral stent, inability to perform ureteroscopy, need for an interval procedure, inability to clear stone burden, stent discomfort/pain, heart attack, stroke, pulmonary embolus and the inherent risks with general anesthesia.   For shockwave lithotripsy I described the risks which include arrhythmia, kidney contusion, kidney hemorrhage, need for transfusion, pain, inability to adequately break up stone, inability to pass stone fragments, Steinstrasse, infection associated with obstructing stones, need for alternate surgical procedure, need for repeat shockwave lithotripsy, MI, CVA, PE and the inherent risks with  anesthesia/conscious sedation.   For PCNL I described the risks including positioning injury, pneumothorax, hydrothorax, need for chest tube, inability to clear stone burden, renal laceration, arterial venous fistula or malformation, need for embolization of kidney, loss of kidney or renal function, need for repeat procedure, need for prolonged nephrostomy tube, ureteral avulsion, MI, CVA, PE and the inherent risks of general anesthesia.   - The patient would like to proceed with medical expulsive therapy. RTC 1 week with KUB - Urinalysis, Routine w reflex microscopic   No follow-ups on file.  Nicolette Bang, MD  [facility] [place]

## 2022-07-01 NOTE — Patient Instructions (Signed)

## 2022-07-04 ENCOUNTER — Ambulatory Visit (HOSPITAL_COMMUNITY)
Admission: RE | Admit: 2022-07-04 | Discharge: 2022-07-04 | Disposition: A | Discharge status: Home / Self Care | Payer: Medicare Other | Source: Ambulatory Visit | Attending: Urology | Admitting: Urology

## 2022-07-04 ENCOUNTER — Ambulatory Visit (INDEPENDENT_AMBULATORY_CARE_PROVIDER_SITE_OTHER): Payer: Medicare Other | Admitting: Urology

## 2022-07-04 ENCOUNTER — Encounter: Payer: Self-pay | Admitting: Urology

## 2022-07-04 VITALS — BP 122/63 | HR 79

## 2022-07-04 DIAGNOSIS — N200 Calculus of kidney: Secondary | ICD-10-CM | POA: Diagnosis not present

## 2022-07-04 NOTE — Patient Instructions (Signed)

## 2022-07-04 NOTE — Progress Notes (Unsigned)
07/04/2022 10:13 AM   Jackson Bowen [birth year] [id number]  Referring provider: Jaynee Eagles, MD [address] [place],  [place] [address]  Followup nephrolithiasis   HPI: [name] is a 71yo here for followup nephrolithiasis. He does not know if he passed his calculus. KUB from today does not shows the calculus in his right ureteral. He denies nay flank pain for the past 2 days. NO significant LUTS. His stream has improved.    PMH: Past Medical History:  Diagnosis Date   Colon cancer ([place])    Colon cancer ([place]) 02/24/2014   Diabetes mellitus without complication (HCC)    High blood cholesterol    Hypertension     Surgical History: Past Surgical History:  Procedure Laterality Date   BACK SURGERY     for ruptured disc   CARPAL TUNNEL RELEASE     left hand   COLONOSCOPY N/A 04/30/2015   Procedure: COLONOSCOPY;  Surgeon: Danie Binder, MD;  [facility];  Service: Endoscopy;  Laterality: N/A;  1245   COLONOSCOPY N/A 06/29/2019   Procedure: COLONOSCOPY;  Surgeon: Danie Binder, MD;  [facility];  Service: Endoscopy;  Laterality: N/A;  8:30am   removal of cyst     right face   Segmental Colectomy     02/07/2013    Home Medications:  Allergies as of 07/04/2022   No Known Allergies      Medication List        Accurate as of July 04, 2022 10:13 AM. If you have any questions, ask your nurse or doctor.          ascorbic acid 500 MG tablet Commonly known as: VITAMIN C Take 500 mg by mouth daily.   aspirin EC 81 MG tablet Take 81 mg by mouth daily.   cyanocobalamin 500 MCG tablet Commonly known as: VITAMIN B12 Take 500 mcg by mouth daily.   Fish Oil 500 MG Caps Take 500 mg by mouth every other day. At night.   HYDROcodone-acetaminophen 5-325 MG tablet Commonly known as: Norco Take 1 tablet by mouth every 4 (four) hours as needed for moderate pain.   Jardiance 10 MG Tabs tablet Generic drug: empagliflozin Take 10 mg  by mouth every other day. In the morning   Lantus SoloStar 100 UNIT/ML Solostar Pen Generic drug: insulin glargine Inject 10 Units into the skin at bedtime.   losartan-hydrochlorothiazide 100-12.5 MG tablet Commonly known as: HYZAAR Take 1 tablet by mouth daily.   lovastatin 40 MG tablet Commonly known as: MEVACOR Take 40 mg by mouth every evening.   metFORMIN 1000 MG tablet Commonly known as: GLUCOPHAGE Take 1,000 mg by mouth 2 (two) times daily.   naproxen 500 MG tablet Commonly known as: NAPROSYN Take 1 tablet (500 mg total) by mouth 2 (two) times daily with a meal.   ondansetron 4 MG disintegrating tablet Commonly known as: ZOFRAN-ODT Take 1 tablet (4 mg total) by mouth every 8 (eight) hours as needed for nausea or vomiting.   tamsulosin 0.4 MG Caps capsule Commonly known as: FLOMAX Take 1 capsule (0.4 mg total) by mouth daily.   UNABLE TO FIND Take 1 capsule by mouth daily. [place] Plus Dietary Supplement   Vitamin D3 50 MCG (2000 UT) Tabs Take 2,000 Units by mouth daily.        Allergies: No Known Allergies  Family History: Family History  Problem Relation Age of Onset   Diabetes Mother  Stroke Father    Cancer Sister    Colon cancer Neg Hx     Social History:  reports that he quit smoking about 32 years ago. His smoking use included cigarettes. He has a 15.00 pack-year smoking history. He has never used smokeless tobacco. He reports that he does not drink alcohol and does not use drugs.  ROS: All other review of systems were reviewed and are negative except what is noted above in HPI  Physical Exam: BP 122/63   Pulse 79   Constitutional:  Alert and oriented, No acute distress. HEENT: [place] AT, moist mucus membranes.  Trachea midline, no masses. Cardiovascular: No clubbing, cyanosis, or edema. Respiratory: Normal respiratory effort, no increased work of breathing. GI: Abdomen is soft, nontender, nondistended, no abdominal masses GU: No CVA  tenderness.  Lymph: No cervical or inguinal lymphadenopathy. Skin: No rashes, bruises or suspicious lesions. Neurologic: Grossly intact, no focal deficits, moving all 4 extremities. Psychiatric: Normal mood and affect.  Laboratory Data: Lab Results  Component Value Date   WBC 6.0 06/23/2022   HGB 12.2 (L) 06/23/2022   HCT 37.1 (L) 06/23/2022   MCV 83.4 06/23/2022   PLT 279 06/23/2022    Lab Results  Component Value Date   CREATININE 2.16 (H) 06/23/2022    No results found for: "PSA"  No results found for: "TESTOSTERONE"  No results found for: "HGBA1C"  Urinalysis    Component Value Date/Time   COLORURINE AMBER (A) 06/23/2022 1756   APPEARANCEUR Clear 07/01/2022 0909   LABSPEC 1.004 (L) 06/23/2022 1756   PHURINE 6.0 06/23/2022 1756   GLUCOSEU Negative 07/01/2022 0909   HGBUR LARGE (A) 06/23/2022 1756   BILIRUBINUR Negative 07/01/2022 0909   KETONESUR NEGATIVE 06/23/2022 1756   PROTEINUR Negative 07/01/2022 0909   PROTEINUR 100 (A) 06/23/2022 1756   NITRITE Negative 07/01/2022 0909   NITRITE NEGATIVE 06/23/2022 1756   LEUKOCYTESUR Negative 07/01/2022 0909   LEUKOCYTESUR MODERATE (A) 06/23/2022 1756    Lab Results  Component Value Date   LABMICR Comment 07/01/2022   BACTERIA RARE (A) 06/23/2022    Pertinent Imaging: KUB 07/04/2022: Images reviewed and discussed with the patient  Results for orders placed during the hospital encounter of 07/01/22  DG Abd 1 View  Narrative CLINICAL DATA:  Kidney stone.  Intermittent RIGHT flank pain.  EXAM: ABDOMEN - 1 VIEW  COMPARISON:  CT abdomen dated 06/23/2022.  FINDINGS: On CT abdomen of 06/23/2022, an 8 mm obstructing stone (versus 2 abutting smaller stones) was identified at the level of the mid/distal RIGHT ureter causing moderate RIGHT-sided hydronephrosis.  Calcifications identified in the pelvis on today's plain film examination are most likely vascular phleboliths. The ureteral stone is not confidently  reproduced on today's plain film.  Bowel gas pattern is nonobstructive. Surgical suture line within the RIGHT abdomen. No acute-appearing osseous abnormality.  IMPRESSION: 1. 8 mm obstructing stone (versus 2 abutting smaller stones) was identified on CT abdomen of 06/23/2022 at the level of the mid/distal RIGHT ureter causing moderate RIGHT-sided hydronephrosis. This stone is not confidently reproduced on today's plain film examination. If persistent pain, consider a follow-up CT examination to exclude persistence of the RIGHT ureteral stone. 2. Vascular phleboliths within the pelvis. 3. Nonobstructive bowel gas pattern.   Electronically Signed By: Franki Cabot M.D. On: 07/01/2022 16:14  No results found for this or any previous visit.  No results found for this or any previous visit.  No results found for this or any previous visit.  No  results found for this or any previous visit.  No results found for this or any previous visit.  No results found for this or any previous visit.  Results for orders placed during the hospital encounter of 06/23/22  CT Renal Stone Study  Narrative CLINICAL DATA:  Flank pain, kidney stone suspected  EXAM: CT ABDOMEN AND PELVIS WITHOUT CONTRAST  TECHNIQUE: Multidetector CT imaging of the abdomen and pelvis was performed following the standard protocol without IV contrast.  RADIATION DOSE REDUCTION: This exam was performed according to the departmental dose-optimization program which includes automated exposure control, adjustment of the mA and/or kV according to patient size and/or use of iterative reconstruction technique.  COMPARISON:  Contrast enhanced CT 12/04/2019  FINDINGS: Lower chest: No acute airspace disease or pleural effusion. Punctate calcified granuloma in the right lower lobe.  Hepatobiliary: Mild hepatic steatosis. No focal lesion on this unenhanced exam layering density in the gallbladder may represent sludge  or stones. No pericholecystic inflammation. No biliary dilatation.  Pancreas: No ductal dilatation or inflammation.  Spleen: Normal in size without focal abnormality.  Adrenals/Urinary Tract: No adrenal nodule. Two adjacent versus a single obstructing stone measuring 8 mm is seen in the right mid distal ureter with moderate hydroureteronephrosis. There are multiple additional nonobstructing intrarenal calculi on the right. Mild right perinephric edema. There are no left renal calculi. 15 mm left renal cyst. This needs no further follow-up imaging is recommended. Urinary bladder is partially distended. No bladder stone or wall thickening.  Stomach/Bowel: Decompressed stomach. There is no small bowel obstruction or inflammation. Post right colectomy. No colonic inflammatory change.  Vascular/Lymphatic: Moderate aortic and branch atherosclerosis. No aortic aneurysm. There are no enlarged lymph nodes in the abdomen or pelvis.  Reproductive: Enlarged prostate gland spans 5.2 cm transverse.  Other: Small fat containing umbilical hernia. No ascites. No free air.  Musculoskeletal: Lumbar degenerative change with multilevel degenerative disc disease and vacuum phenomenon. There are no acute or suspicious osseous abnormalities.  IMPRESSION: 1. Two adjacent versus a single 8 mm obstructing stone in the right mid distal ureter with moderate hydronephrosis. 2. Multiple additional nonobstructing intrarenal calculi on the right. 3. Hepatic steatosis. 4. Enlarged prostate gland.  Aortic Atherosclerosis (ICD10-I70.0).   Electronically Signed By: Keith Rake M.D. On: 06/23/2022 21:41   Assessment & Plan:    1. Kidney stones -RTC 2 weeks with a KUB - Urinalysis, Routine w reflex microscopic   No follow-ups on file.  Nicolette Bang, MD  [facility] [place]

## 2022-07-16 ENCOUNTER — Ambulatory Visit (HOSPITAL_COMMUNITY)
Admission: RE | Admit: 2022-07-16 | Discharge: 2022-07-16 | Disposition: A | Discharge status: Home / Self Care | Payer: Medicare Other | Source: Ambulatory Visit | Attending: Urology | Admitting: Urology

## 2022-07-16 ENCOUNTER — Ambulatory Visit: Payer: Medicare Other | Admitting: Urology

## 2022-07-16 VITALS — BP 128/61 | HR 76

## 2022-07-16 DIAGNOSIS — N200 Calculus of kidney: Secondary | ICD-10-CM | POA: Insufficient documentation

## 2022-07-16 DIAGNOSIS — Z87442 Personal history of urinary calculi: Secondary | ICD-10-CM | POA: Diagnosis not present

## 2022-07-16 LAB — URINALYSIS, ROUTINE W REFLEX MICROSCOPIC
Bilirubin, UA: NEGATIVE
Glucose, UA: NEGATIVE
Ketones, UA: NEGATIVE
Leukocytes,UA: NEGATIVE
Nitrite, UA: NEGATIVE
Protein,UA: NEGATIVE
RBC, UA: NEGATIVE
Specific Gravity, UA: 1.015 (ref 1.005–1.030)
Urobilinogen, Ur: 0.2 mg/dL (ref 0.2–1.0)
pH, UA: 5 (ref 5.0–7.5)

## 2022-07-16 NOTE — Patient Instructions (Signed)

## 2022-07-16 NOTE — Progress Notes (Signed)
07/16/2022 10:35 AM   Jackson Bowen 1951/05/06 [id number]  Referring provider: Jaynee Eagles, MD [address] [place],  [place] [address]  Followup nephrolithiasis   HPI: Jackson Bowen is a 71yo here for followup for nephrolithiasis. He does not believe he passed his ureteral calculus. CT from today shows passage of the right ureteral calculus and resolution of his hydronephrosis. He denies any worsening LUTS. No other complaints today   PMH: Past Medical History:  Diagnosis Date   Colon cancer ([place])    Colon cancer ([place]) 02/24/2014   Diabetes mellitus without complication (HCC)    High blood cholesterol    Hypertension     Surgical History: Past Surgical History:  Procedure Laterality Date   BACK SURGERY     for ruptured disc   CARPAL TUNNEL RELEASE     left hand   COLONOSCOPY N/A 04/30/2015   Procedure: COLONOSCOPY;  Surgeon: Danie Binder, MD;  Location: [facility];  Service: Endoscopy;  Laterality: N/A;  1245   COLONOSCOPY N/A 06/29/2019   Procedure: COLONOSCOPY;  Surgeon: Danie Binder, MD;  Location: [facility];  Service: Endoscopy;  Laterality: N/A;  8:30am   removal of cyst     right face   Segmental Colectomy     02/07/2013    Home Medications:  Allergies as of 07/16/2022   No Known Allergies      Medication List        Accurate as of July 16, 2022 10:35 AM. If you have any questions, ask your nurse or doctor.          ascorbic acid 500 MG tablet Commonly known as: VITAMIN C Take 500 mg by mouth daily.   aspirin EC 81 MG tablet Take 81 mg by mouth daily.   cyanocobalamin 500 MCG tablet Commonly known as: VITAMIN B12 Take 500 mcg by mouth daily.   Fish Oil 500 MG Caps Take 500 mg by mouth every other day. At night.   HYDROcodone-acetaminophen 5-325 MG tablet Commonly known as: Norco Take 1 tablet by mouth every 4 (four) hours as needed for moderate pain.   Jardiance 10 MG Tabs tablet Generic drug:  empagliflozin Take 10 mg by mouth every other day. In the morning   Lantus SoloStar 100 UNIT/ML Solostar Pen Generic drug: insulin glargine Inject 10 Units into the skin at bedtime.   losartan-hydrochlorothiazide 100-12.5 MG tablet Commonly known as: HYZAAR Take 1 tablet by mouth daily.   lovastatin 40 MG tablet Commonly known as: MEVACOR Take 40 mg by mouth every evening.   metFORMIN 1000 MG tablet Commonly known as: GLUCOPHAGE Take 1,000 mg by mouth 2 (two) times daily.   naproxen 500 MG tablet Commonly known as: NAPROSYN Take 1 tablet (500 mg total) by mouth 2 (two) times daily with a meal.   ondansetron 4 MG disintegrating tablet Commonly known as: ZOFRAN-ODT Take 1 tablet (4 mg total) by mouth every 8 (eight) hours as needed for nausea or vomiting.   tamsulosin 0.4 MG Caps capsule Commonly known as: FLOMAX Take 1 capsule (0.4 mg total) by mouth daily.   UNABLE TO FIND Take 1 capsule by mouth daily. [place] Plus Dietary Supplement   Vitamin D3 50 MCG (2000 UT) Tabs Take 2,000 Units by mouth daily.        Allergies: No Known Allergies  Family History: Family History  Problem Relation Age of Onset   Diabetes Mother    Stroke Father  Cancer Sister    Colon cancer Neg Hx     Social History:  reports that he quit smoking about 32 years ago. His smoking use included cigarettes. He has a 15.00 pack-year smoking history. He has never used smokeless tobacco. He reports that he does not drink alcohol and does not use drugs.  ROS: All other review of systems were reviewed and are negative except what is noted above in HPI  Physical Exam: BP 128/61   Pulse 76   Constitutional:  Alert and oriented, No acute distress. HEENT: [place] AT, moist mucus membranes.  Trachea midline, no masses. Cardiovascular: No clubbing, cyanosis, or edema. Respiratory: Normal respiratory effort, no increased work of breathing. GI: Abdomen is soft, nontender, nondistended, no abdominal  masses GU: No CVA tenderness.  Lymph: No cervical or inguinal lymphadenopathy. Skin: No rashes, bruises or suspicious lesions. Neurologic: Grossly intact, no focal deficits, moving all 4 extremities. Psychiatric: Normal mood and affect.  Laboratory Data: Lab Results  Component Value Date   WBC 6.0 06/23/2022   HGB 12.2 (L) 06/23/2022   HCT 37.1 (L) 06/23/2022   MCV 83.4 06/23/2022   PLT 279 06/23/2022    Lab Results  Component Value Date   CREATININE 2.16 (H) 06/23/2022    No results found for: "PSA"  No results found for: "TESTOSTERONE"  No results found for: "HGBA1C"  Urinalysis    Component Value Date/Time   COLORURINE AMBER (A) 06/23/2022 1756   APPEARANCEUR Clear 07/01/2022 0909   LABSPEC 1.004 (L) 06/23/2022 1756   PHURINE 6.0 06/23/2022 1756   GLUCOSEU Negative 07/01/2022 0909   HGBUR LARGE (A) 06/23/2022 1756   BILIRUBINUR Negative 07/01/2022 0909   KETONESUR NEGATIVE 06/23/2022 1756   PROTEINUR Negative 07/01/2022 0909   PROTEINUR 100 (A) 06/23/2022 1756   NITRITE Negative 07/01/2022 0909   NITRITE NEGATIVE 06/23/2022 1756   LEUKOCYTESUR Negative 07/01/2022 0909   LEUKOCYTESUR MODERATE (A) 06/23/2022 1756    Lab Results  Component Value Date   LABMICR Comment 07/01/2022   BACTERIA RARE (A) 06/23/2022    Pertinent Imaging: CT stone study today: Images reviewed and discussed with the patient  Results for orders placed during the hospital encounter of 07/04/22  Abdomen 1 view ([facility])  Narrative CLINICAL DATA:  Nephrolithiasis.  EXAM: ABDOMEN - 1 VIEW  COMPARISON:  Abdominal radiograph dated 07/01/2022 and CT dated 06/23/2022.  FINDINGS: Large amount of stool throughout the colon limits evaluation for kidney stone. No definite radiopaque calculi noted over the renal silhouette or along the expected course of the ureters. No bowel dilatation or evidence of obstruction. Postsurgical changes of bowel with anastomotic suture in the right lower  quadrant. Degenerative changes of the spine. No acute osseous pathology. The soft tissues are unremarkable.  IMPRESSION: 1. No definite radiopaque calculi noted. 2. Large amount of stool throughout the colon.   Electronically Signed By: Anner Crete M.D. On: 07/04/2022 19:56  No results found for this or any previous visit.  No results found for this or any previous visit.  No results found for this or any previous visit.  No results found for this or any previous visit.  No results found for this or any previous visit.  No results found for this or any previous visit.  Results for orders placed during the hospital encounter of 06/23/22  CT Renal Stone Study  Narrative CLINICAL DATA:  Flank pain, kidney stone suspected  EXAM: CT ABDOMEN AND PELVIS WITHOUT CONTRAST  TECHNIQUE: Multidetector CT imaging of the  abdomen and pelvis was performed following the standard protocol without IV contrast.  RADIATION DOSE REDUCTION: This exam was performed according to the departmental dose-optimization program which includes automated exposure control, adjustment of the mA and/or kV according to patient size and/or use of iterative reconstruction technique.  COMPARISON:  Contrast enhanced CT 12/04/2019  FINDINGS: Lower chest: No acute airspace disease or pleural effusion. Punctate calcified granuloma in the right lower lobe.  Hepatobiliary: Mild hepatic steatosis. No focal lesion on this unenhanced exam layering density in the gallbladder may represent sludge or stones. No pericholecystic inflammation. No biliary dilatation.  Pancreas: No ductal dilatation or inflammation.  Spleen: Normal in size without focal abnormality.  Adrenals/Urinary Tract: No adrenal nodule. Two adjacent versus a single obstructing stone measuring 8 mm is seen in the right mid distal ureter with moderate hydroureteronephrosis. There are multiple additional nonobstructing intrarenal calculi  on the right. Mild right perinephric edema. There are no left renal calculi. 15 mm left renal cyst. This needs no further follow-up imaging is recommended. Urinary bladder is partially distended. No bladder stone or wall thickening.  Stomach/Bowel: Decompressed stomach. There is no small bowel obstruction or inflammation. Post right colectomy. No colonic inflammatory change.  Vascular/Lymphatic: Moderate aortic and branch atherosclerosis. No aortic aneurysm. There are no enlarged lymph nodes in the abdomen or pelvis.  Reproductive: Enlarged prostate gland spans 5.2 cm transverse.  Other: Small fat containing umbilical hernia. No ascites. No free air.  Musculoskeletal: Lumbar degenerative change with multilevel degenerative disc disease and vacuum phenomenon. There are no acute or suspicious osseous abnormalities.  IMPRESSION: 1. Two adjacent versus a single 8 mm obstructing stone in the right mid distal ureter with moderate hydronephrosis. 2. Multiple additional nonobstructing intrarenal calculi on the right. 3. Hepatic steatosis. 4. Enlarged prostate gland.  Aortic Atherosclerosis (ICD10-I70.0).   Electronically Signed By: Keith Rake M.D. On: 06/23/2022 21:41   Assessment & Plan:    1. Kidney stones -Dietary handout given -RTC 6 months with KUB - Urinalysis, Routine w reflex microscopic   No follow-ups on file.  Nicolette Bang, MD  [facility] [place]

## 2022-07-22 ENCOUNTER — Encounter: Payer: Self-pay | Admitting: Urology

## 2023-01-16 ENCOUNTER — Ambulatory Visit: Payer: Medicare Other | Admitting: Urology
